# Patient Record
Sex: Male | Born: 1950 | Race: White | Hispanic: No | Marital: Married | Smoking: Never smoker
Health system: Southern US, Community
[De-identification: ages and names within clinical notes are randomized; demographics above are authoritative.]

## PROBLEM LIST (undated history)

## (undated) DIAGNOSIS — C189 Malignant neoplasm of colon, unspecified: Secondary | ICD-10-CM

## (undated) DIAGNOSIS — G4730 Sleep apnea, unspecified: Secondary | ICD-10-CM

## (undated) DIAGNOSIS — Z5189 Encounter for other specified aftercare: Secondary | ICD-10-CM

## (undated) DIAGNOSIS — I10 Essential (primary) hypertension: Secondary | ICD-10-CM

## (undated) DIAGNOSIS — T7840XA Allergy, unspecified, initial encounter: Secondary | ICD-10-CM

## (undated) DIAGNOSIS — E669 Obesity, unspecified: Secondary | ICD-10-CM

## (undated) HISTORY — DX: Sleep apnea, unspecified: G47.30

## (undated) HISTORY — DX: Allergy, unspecified, initial encounter: T78.40XA

## (undated) HISTORY — DX: Essential (primary) hypertension: I10

## (undated) HISTORY — DX: Obesity, unspecified: E66.9

## (undated) HISTORY — PX: TONSILLECTOMY AND ADENOIDECTOMY: SHX28

## (undated) HISTORY — DX: Encounter for other specified aftercare: Z51.89

---

## 1978-08-23 HISTORY — PX: NOSE SURGERY: SHX723

## 1982-08-23 DIAGNOSIS — IMO0001 Reserved for inherently not codable concepts without codable children: Secondary | ICD-10-CM

## 1982-08-23 DIAGNOSIS — Z5189 Encounter for other specified aftercare: Secondary | ICD-10-CM

## 1982-08-23 HISTORY — DX: Reserved for inherently not codable concepts without codable children: IMO0001

## 1982-08-23 HISTORY — DX: Encounter for other specified aftercare: Z51.89

## 1982-08-23 HISTORY — PX: OTHER SURGICAL HISTORY: SHX169

## 2010-04-20 ENCOUNTER — Emergency Department (HOSPITAL_COMMUNITY): Admission: EM | Admit: 2010-04-20 | Discharge: 2010-04-20 | Payer: Self-pay | Admitting: Family Medicine

## 2010-10-22 HISTORY — PX: ANKLE FUSION: SHX881

## 2010-11-12 ENCOUNTER — Encounter (HOSPITAL_COMMUNITY)
Admission: RE | Admit: 2010-11-12 | Discharge: 2010-11-12 | Disposition: A | Discharge status: Home / Self Care | Payer: PRIVATE HEALTH INSURANCE | Source: Ambulatory Visit | Attending: Orthopedic Surgery | Admitting: Orthopedic Surgery

## 2010-11-12 ENCOUNTER — Other Ambulatory Visit (HOSPITAL_COMMUNITY): Payer: Self-pay | Admitting: Orthopedic Surgery

## 2010-11-12 DIAGNOSIS — M19072 Primary osteoarthritis, left ankle and foot: Secondary | ICD-10-CM

## 2010-11-12 LAB — CBC
HCT: 43.7 % (ref 39.0–52.0)
MCHC: 36.6 g/dL — ABNORMAL HIGH (ref 30.0–36.0)
MCV: 89.9 fL (ref 78.0–100.0)
Platelets: 152 10*3/uL (ref 150–400)
RBC: 4.86 MIL/uL (ref 4.22–5.81)
RDW: 12.7 % (ref 11.5–15.5)

## 2010-11-12 LAB — BASIC METABOLIC PANEL
BUN: 12 mg/dL (ref 6–23)
Chloride: 111 mEq/L (ref 96–112)
Creatinine, Ser: 1.22 mg/dL (ref 0.4–1.5)
GFR calc Af Amer: >60 mL/min (ref >60)
GFR calc non Af Amer: >60 mL/min (ref >60)
Glucose, Bld: 192 mg/dL — ABNORMAL HIGH (ref 70–99)
Potassium: 4.4 mEq/L (ref 3.5–5.1)
Sodium: 143 mEq/L (ref 135–145)

## 2010-11-12 LAB — SURGICAL PCR SCREEN: MRSA, PCR: NEGATIVE

## 2010-11-13 ENCOUNTER — Observation Stay (HOSPITAL_COMMUNITY)
Admission: RE | Admit: 2010-11-13 | Discharge: 2010-11-14 | Disposition: A | Discharge status: Home / Self Care | Payer: PRIVATE HEALTH INSURANCE | Source: Ambulatory Visit | Attending: Orthopedic Surgery | Admitting: Orthopedic Surgery

## 2010-11-13 ENCOUNTER — Ambulatory Visit (HOSPITAL_BASED_OUTPATIENT_CLINIC_OR_DEPARTMENT_OTHER)
Admission: RE | Admit: 2010-11-13 | Payer: PRIVATE HEALTH INSURANCE | Source: Ambulatory Visit | Admitting: Orthopedic Surgery

## 2010-11-13 ENCOUNTER — Ambulatory Visit (HOSPITAL_COMMUNITY): Payer: PRIVATE HEALTH INSURANCE

## 2010-11-13 DIAGNOSIS — E669 Obesity, unspecified: Secondary | ICD-10-CM | POA: Insufficient documentation

## 2010-11-13 DIAGNOSIS — M19079 Primary osteoarthritis, unspecified ankle and foot: Principal | ICD-10-CM | POA: Insufficient documentation

## 2010-11-13 DIAGNOSIS — E119 Type 2 diabetes mellitus without complications: Secondary | ICD-10-CM | POA: Insufficient documentation

## 2010-11-13 DIAGNOSIS — I10 Essential (primary) hypertension: Secondary | ICD-10-CM | POA: Insufficient documentation

## 2010-11-13 DIAGNOSIS — Z01812 Encounter for preprocedural laboratory examination: Secondary | ICD-10-CM | POA: Insufficient documentation

## 2010-11-13 LAB — GLUCOSE, CAPILLARY: Glucose-Capillary: 206 mg/dL — ABNORMAL HIGH (ref 70–99)

## 2010-11-16 LAB — GLUCOSE, CAPILLARY: Glucose-Capillary: 143 mg/dL — ABNORMAL HIGH (ref 70–99)

## 2010-11-17 NOTE — Op Note (Signed)
NAME:  Daniel Greer, Daniel Greer NO.:  [id number]  MEDICAL RECORD NO.:  [id number]           PATIENT TYPE:  O  LOCATION:  5041                         FACILITY:  [facility]  PHYSICIAN:  Toni Arthurs, MD        DATE OF BIRTH:  1951/03/16  DATE OF PROCEDURE:  11/13/2010 DATE OF DISCHARGE:                              OPERATIVE REPORT   PREOPERATIVE DIAGNOSIS:  Left ankle varus arthritis.  POSTOPERATIVE DIAGNOSIS:  Left ankle varus arthritis.  PROCEDURE: 1. Left hip aspiration of iliac crest bone marrow. 2. Left ankle arthrodesis. 3. Intraoperative interpretation of fluoroscopic images greater than 1     hour.  SURGEON:  Toni Arthurs, MD  ANESTHESIA:  General, regional.  IV FLUIDS:  See Anesthesia record.  ESTIMATED BLOOD LOSS:  Minimal.  TOURNIQUET TIME:  2 hours and 22 minutes at 250 mmHg.  COMPLICATIONS:  None apparent.  DISPOSITION:  Extubated awake and stable to recovery.  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  He presents now for arthrodesis of his left ankle having failed conservative treatments.  He understands the risks and benefits of this procedure as well as the alternative treatment options.  Specifically, he understands risks of bleeding, infection, nerve damage, blood clots, need for additional surgery, amputation, and death.  PROCEDURE IN DETAIL:  After operative consent was obtained and the correct operative site was identified, the patient was brought to the operating room and placed supine on the operating table.  General anesthesia was induced.  Preoperative antibiotics were administered. Surgical time-out was taken.  The left hip was prepped and draped.  A stab incision was made 3 fingerbreadths posterior from the superior aspect of the ASIS.  A sharp trocar was advanced down through the subcutaneous tissue to the superior border of the iliac crest.  The trocar was  centered on the crest and driven through the outer cortex with a mallet.  The sharp trocar was exchanged for a blunt trocar and the sheath was advanced into the iliac crest bone.  A 60 mL of bone marrow were withdrawn from the iliac crest in 5 mL draws in alternating locations.  The bone marrow was passed off the field to the perfusionist.  This was spun down to 7 mL of concentrated bone marrow aspirate.  It was maintained in a sterile environment until later in the case.  The stab incision was dressed with a Steri-Strip and a sterile dressing after removal of the trocar and cannula.  The left lower extremity was then prepped and draped in standard sterile fashion.  An anterior incision was marked over the ankle joint.  The extremity was exsanguinated, and tourniquet was inflated to 250 mmHg. The anterior incision was made.  Sharp dissection was carried down through the skin to the level of the extensor retinaculum taking care to protect the crossing branches of superficial peroneal nerve.  Extensor retinaculum was divided over the extensor hallucis longus tendon.  The interval between the tibialis anterior and the EHL was developed. Neurovascular bundle was mobilized off the anterior aspect  of the tibia. The subperiosteal dissection was carried medially and laterally.  The soft tissues were released from around the medial malleolus allowing correction of varus malalignment of the ankle.  A lamina spreader was used to open the ankle joint.  All remaining cartilage was removed with an osteotome and curettes.  The wound was irrigated copiously.  A 3.5-mm drill was used to perforate the subchondral bone throughout the talar dome and tibial plafond.  A quarter-inch straight osteotome was then used to break up the remaining subchondral bone between the drill holes. At this point, the 7 mL of concentrated bone marrow aspirate were mixed with allograft cancellous bone chips.  These were packed  into the ankle joint.  The ankle was reduced and held in neutral alignment.  A 2.4-mm guidepin was used to provisionally fix the ankle.  An anterolateral plate was selected from the Integra Tibiaxys set.  The plate was inserted.  A drill bit was inserted through the locking sleeve distally into the talar neck.  AP and lateral x-ray showed appropriate position of the plate.  Three bicortical locking screws were then inserted through the distal limb of the plate securing the plate to the talar neck.  The compression device was then fixed to the proximal limb of the plate.  A 3.5-mm screw was inserted in bicortical fashion securing the compression device to the tibia.  The compression device was tightened compressing the ankle joint.  Three bicortical locking screws were then inserted through the plate securing the plate to the tibia.  All 3 screws were then locked to the plate with lock washers.  The compression device was removed and a fourth bicortical screw was inserted and locked.  AP and lateral views showed appropriate position and length of all hardware and appropriate compression of the ankle joint.  An anterolateral plate was then applied in a similar fashion.  At this point, the most distal hole in the proximal limb of the medial plate was selected and drilled.  A fully-threaded screw was then placed through this hole into the most posterolateral aspect of the talar dome.  This was repeated for the anteromedial plate.  Final AP, mortise, and lateral views of the ankle showed appropriate position and length of all hardware and appropriate reduction of the ankle joint as well as appropriate compression across the ankle joint.  The remaining bone graft was packed around the edges of the joint filling at all remaining cracks.  The wound was irrigated copiously.  The balance of the forefoot was assessed and there was no evidence of excessive plantar flexion of the first ray.  The hind  foot was noted to be in appropriate neutral to slightly valgus alignment.  The 0 Vicryl simple sutures were used to close the anterior ankle joint capsule over the plates.  The extensor retinaculum was then closed with simple sutures of 0 Vicryl. Subcutaneous tissues were closed with inverted simple sutures of 2-0 Monocryl.  A running 3-0 Prolene skin suture was used to close the incision.  Sterile dressings were applied followed by a well-padded short-leg splint.  The provisional guide pin had been removed prior to application of the anteromedial plate.  The tourniquet was released after application of the dressings.  The patient was then awakened from anesthesia and transported to recovery room in stable condition.  FOLLOW UP PLAN:  The patient will be nonweightbearing on the left lower extremity.  He will be admitted overnight for observation for pain control.  He  will follow up with me in 2 weeks in clinic.     Toni Arthurs, MD    JH/MEDQ  D:  11/13/2010  T:  11/14/2010  Job:  [id number]  Electronically Signed by Toni Arthurs  on 11/17/2010 01:38:42 PM

## 2011-04-14 ENCOUNTER — Other Ambulatory Visit: Payer: Self-pay | Admitting: Orthopaedic Surgery

## 2011-04-14 DIAGNOSIS — M25511 Pain in right shoulder: Secondary | ICD-10-CM

## 2011-04-20 ENCOUNTER — Other Ambulatory Visit: Payer: Self-pay | Admitting: Orthopaedic Surgery

## 2011-04-20 DIAGNOSIS — Z139 Encounter for screening, unspecified: Secondary | ICD-10-CM

## 2011-04-21 ENCOUNTER — Ambulatory Visit
Admission: RE | Admit: 2011-04-21 | Discharge: 2011-04-21 | Disposition: A | Discharge status: Home / Self Care | Payer: PRIVATE HEALTH INSURANCE | Source: Ambulatory Visit | Attending: Orthopaedic Surgery | Admitting: Orthopaedic Surgery

## 2011-04-21 DIAGNOSIS — M25511 Pain in right shoulder: Secondary | ICD-10-CM

## 2011-04-21 DIAGNOSIS — Z139 Encounter for screening, unspecified: Secondary | ICD-10-CM

## 2011-04-21 MED ORDER — IOHEXOL 180 MG/ML  SOLN
11.0000 mL | Freq: Once | INTRAMUSCULAR | Status: AC | PRN
  Administered 2011-04-21: 11 mL via INTRA_ARTICULAR

## 2011-04-24 HISTORY — PX: ROTATOR CUFF REPAIR: SHX139

## 2011-06-03 ENCOUNTER — Encounter: Payer: Self-pay | Admitting: Orthopaedic Surgery

## 2011-06-24 ENCOUNTER — Encounter: Payer: Self-pay | Admitting: Orthopaedic Surgery

## 2011-07-24 ENCOUNTER — Encounter: Payer: Self-pay | Admitting: Orthopaedic Surgery

## 2011-07-29 ENCOUNTER — Encounter: Payer: Self-pay | Admitting: Internal Medicine

## 2011-08-11 ENCOUNTER — Other Ambulatory Visit: Payer: PRIVATE HEALTH INSURANCE | Admitting: Internal Medicine

## 2011-08-24 ENCOUNTER — Encounter: Payer: Self-pay | Admitting: Orthopaedic Surgery

## 2011-08-24 HISTORY — PX: COLONOSCOPY: SHX174

## 2011-08-24 HISTORY — PX: OTHER SURGICAL HISTORY: SHX169

## 2011-09-07 ENCOUNTER — Ambulatory Visit (AMBULATORY_SURGERY_CENTER): Payer: PRIVATE HEALTH INSURANCE

## 2011-09-07 VITALS — Ht 76.0 in | Wt 282.9 lb

## 2011-09-07 DIAGNOSIS — Z1211 Encounter for screening for malignant neoplasm of colon: Secondary | ICD-10-CM

## 2011-09-07 MED ORDER — PEG-KCL-NACL-NASULF-NA ASC-C 100 G PO SOLR: 1.0000 | Freq: Once | ORAL | Status: AC

## 2011-09-13 ENCOUNTER — Telehealth: Payer: Self-pay | Admitting: Internal Medicine

## 2011-09-13 NOTE — Telephone Encounter (Signed)
Pt's home contact number: [phone] is no longer in service.  Left message on pt's cell phone to return phone call.  Daniel Greer

## 2011-09-14 ENCOUNTER — Telehealth: Payer: Self-pay | Admitting: *Deleted

## 2011-09-14 NOTE — Telephone Encounter (Signed)
Explained to pts wife that MOVI PREP is a superior prep and preferred by Dr. Marina Goodell. /explained in detail how the generic prep is taken and the increase in the volume.  Pt. Wife said she would discuss with her husband and let us know what he decides.  She's pretty sure he was given the Rebate coupon.  Daniel Greer

## 2011-09-14 NOTE — Telephone Encounter (Signed)
Left message on cell number.  Daniel Greer

## 2011-09-21 ENCOUNTER — Encounter: Payer: Self-pay | Admitting: Internal Medicine

## 2011-09-21 ENCOUNTER — Ambulatory Visit (AMBULATORY_SURGERY_CENTER): Payer: PRIVATE HEALTH INSURANCE | Admitting: Internal Medicine

## 2011-09-21 VITALS — BP 170/98 | HR 66 | Temp 97.9°F | Resp 18 | Ht 76.0 in | Wt 282.0 lb

## 2011-09-21 DIAGNOSIS — C180 Malignant neoplasm of cecum: Secondary | ICD-10-CM

## 2011-09-21 DIAGNOSIS — Z1211 Encounter for screening for malignant neoplasm of colon: Secondary | ICD-10-CM

## 2011-09-21 DIAGNOSIS — D126 Benign neoplasm of colon, unspecified: Secondary | ICD-10-CM

## 2011-09-21 MED ORDER — SODIUM CHLORIDE 0.9 % IV SOLN: 500.0000 mL | INTRAVENOUS | Status: DC

## 2011-09-21 NOTE — Progress Notes (Signed)
Patient did not experience any of the following events: a burn prior to discharge; a fall within the facility; wrong site/side/patient/procedure/implant event; or a hospital transfer or hospital admission upon discharge from the facility. (G8907) Patient did not have preoperative order for IV antibiotic SSI prophylaxis. (G8918)  

## 2011-09-21 NOTE — Progress Notes (Signed)
2nd bag of ivf hung during procedure

## 2011-09-21 NOTE — Op Note (Signed)
[facility] [address] [place], [place]  [address]  COLONOSCOPY PROCEDURE REPORT  PATIENT:  Daniel Greer, Daniel Greer  MR#:  [id number] BIRTHDATE:  02/05/1951, 60 yrs. old  GENDER:  male ENDOSCOPIST:  Wilhemina Bonito. Eda Keys, MD REF. BY:  Guerry Bruin, M.D. PROCEDURE DATE:  09/21/2011 PROCEDURE:  Colonoscopy with snare polypectomy x 3 ASA CLASS:  Class II INDICATIONS:  Routine Risk Screening MEDICATIONS:   MAC sedation, administered by CRNA, propofol (Diprivan) 650 mg IV  DESCRIPTION OF PROCEDURE:   After the risks benefits and alternatives of the procedure were thoroughly explained, informed consent was obtained.  Digital rectal exam was performed and revealed no abnormalities.   The LB 180AL [id number] endoscope was introduced through the anus and advanced to the cecum, which was identified by both the appendix and ileocecal valve, without limitations.  The quality of the prep was excellent, using MoviPrep.  The instrument was then slowly withdrawn as the colon was fully examined. <<PROCEDUREIMAGES>>  FINDINGS:  A 30mm sessile polyp was found on the distal ileocecal valve. It was removed piecemeal  with monopolar cautery. Retrieval was successful. Two polyps, 54mm,2mm in the transverse colon were snared without cautery and retrieved. Otherwise normal colonoscopy without other polyps, masses, vascular ectasias, or inflammatory changes.   Retroflexed views in the rectum revealed no abnormalities.    The time to cecum =2:58  minutes. The scope was then withdrawn in 32:44  minutes from the cecum and the procedure completed.  COMPLICATIONS:  None  ENDOSCOPIC IMPRESSION: 1) Large Sessile polyp at the ileocecal valve - removed 2) Two polyps in the transverse colon - removed 3) Otherwise normal colonoscopy  RECOMMENDATIONS: 1) Repeat Colonoscopy in 3 MONTHS (if no cancer in largest polyp) ______________________________ Wilhemina Bonito. Eda Keys, MD  CC:  The Patient;  Guerry Bruin,  MD  n. eSIGNED:   Wilhemina Bonito. Eda Keys at 09/21/2011 09:05 AM  Jeanell Sparrow, [phone]

## 2011-09-21 NOTE — Patient Instructions (Signed)
Discharge instructions given with verbal understanding. Handout on polyps given. Resume previous medications. 

## 2011-09-21 NOTE — Progress Notes (Signed)
Pressure applied to the abdomen to reach cecum. 

## 2011-09-22 ENCOUNTER — Telehealth: Payer: Self-pay

## 2011-09-22 NOTE — Telephone Encounter (Signed)
Left message on answering machine. 

## 2011-09-30 ENCOUNTER — Telehealth: Payer: Self-pay

## 2011-09-30 NOTE — Telephone Encounter (Signed)
Spoke with pt and he is aware of the appt date and time with Dr. Johna Sheriff.

## 2011-09-30 NOTE — Telephone Encounter (Signed)
Message copied by Michele Mcalpine on Thu Sep 30, 2011 10:45 AM ------      Message from: Hilarie Fredrickson      Created: Wed Sep 29, 2011  5:11 PM       That's fine Bonita Quin. Please secure that appointment and let patient know (call him on his mobile phone). Thanks             jp      ----- Message -----         From: Lily Lovings, RN         Sent: 09/29/2011  11:47 AM           To: Yancey Flemings, MD            Dr. Marina Goodell,            The 1st available with [facility] is 10/15/11 arrival time 2:20pm for a 2:45pm appt. Is that ok with you?            Bonita Quin            ----- Message -----         From: Yancey Flemings, MD         Sent: 09/29/2011  11:27 AM           To: Lily Lovings, RN            Bonita Quin, I spoke with Mr. Corrales regarding the polypectomy results. There was invasive cancer in the polypectomy specimen. He needs to see a general surgeon regarding laparoscopic right hemicolectomy. I explained this to him. Please arrange an appointment with Dr. Glenna Fellows. Please call the patient back on his mobile phone regarding that appointment. Keep me posted. Thanks. Dr. Marina Goodell

## 2011-10-15 ENCOUNTER — Ambulatory Visit (INDEPENDENT_AMBULATORY_CARE_PROVIDER_SITE_OTHER): Payer: PRIVATE HEALTH INSURANCE | Admitting: General Surgery

## 2011-10-15 ENCOUNTER — Encounter (INDEPENDENT_AMBULATORY_CARE_PROVIDER_SITE_OTHER): Payer: Self-pay | Admitting: General Surgery

## 2011-10-15 VITALS — BP 160/98 | HR 76 | Temp 97.8°F | Resp 18 | Ht 76.0 in | Wt 286.6 lb

## 2011-10-15 DIAGNOSIS — C182 Malignant neoplasm of ascending colon: Secondary | ICD-10-CM

## 2011-10-15 NOTE — Progress Notes (Signed)
Subjective:   New diagnosis colon cancer  Patient ID: Daniel Greer, male   DOB: 06/15/1951, 60 y.o.   MRN: [id number]  HPI Patient is a very pleasant 60-year-old male referred through the courtesy of Dr. Perry for a new diagnosis of adenocarcinoma of the colon. The patient recently underwent his initial screening colonoscopy. He had not been having any abdominal or GI symptoms whatsoever. Specifically he denies abdominal pain, bloating, nausea, vomiting, change in his bowel habits or melena. Weight has been stable. He's had no other complaints.  I reviewed the colonoscopy report. The most significant finding was a 30 mm sessile polyp at the ileocecal valve. This was removed piecemeal. 2 very small polyps were removed from the transverse colon.  Pathology has returned showing focal invasive adenocarcinoma arising in multiple fragments of a tubulovillous adenoma. The small polyps were benign adenomatous without dysplasia. The patient is here to discuss further treatment planning.  Past Medical History  Diagnosis Date  . Diabetes mellitus   . Hypertension   . Sleep apnea     hx of   Past Surgical History  Procedure Date  . Ankle fusion     left  . Rotator cuff repair     right  . Tonsillectomy and adenoidectomy   . Nose surgery    Current Outpatient Prescriptions  Medication Sig Dispense Refill  . benazepril (LOTENSIN) 20 MG tablet Take 20 mg by mouth daily.      . metFORMIN (GLUCOPHAGE) 500 MG tablet Take 500 mg by mouth 2 (two) times daily with a meal.       Current Facility-Administered Medications  Medication Dose Route Frequency Provider Last Rate Last Dose  . 0.9 %  sodium chloride infusion  500 mL Intravenous Continuous John Perry, MD       No Known Allergies History  Substance Use Topics  . Smoking status: Never Smoker   . Smokeless tobacco: Never Used  . Alcohol Use: No    Review of Systems  Constitutional: Negative.   HENT: Negative.   Eyes: Negative.     Respiratory: Negative.   Cardiovascular: Positive for leg swelling.  Gastrointestinal: Negative.        Mild occasional GERD  Genitourinary: Negative.   Musculoskeletal: Positive for arthralgias.  Neurological: Negative.   Hematological: Negative.   Psychiatric/Behavioral: Negative.        Objective:   Physical Exam BP 160/98  Pulse 76  Temp(Src) 97.8 F (36.6 C) (Temporal)  Resp 18  Ht 6' 4" (1.93 m)  Wt 286 lb 9.6 oz (130.001 kg)  BMI 34.89 kg/m2 General: Alert, moderately obese Caucasian male, in no distress Skin: Warm and dry without rash or infection. HEENT: No palpable masses or thyromegaly. Sclera nonicteric. Pupils equal round and reactive. Oropharynx clear. Lymph nodes: No cervical, supraclavicular, or inguinal nodes palpable. Lungs: Breath sounds clear and equal without increased work of breathing Cardiovascular: Regular rate and rhythm without murmur. No JVD or edema. Peripheral pulses intact. Abdomen: Nondistended. Soft and nontender. No masses palpable. No organomegaly. No palpable hernias. Extremities: No edema or joint swelling or deformity. No chronic venous stasis changes. Incision right shoulder with limited extension right shoulder Neurologic: Alert and fully oriented. Gait normal.     Assessment:     New diagnosis of adenocarcinoma arising in a villous adenoma at the ileocecal valve. He will require partial colectomy. I discussed the nature of the diagnosis and treatment the patient and his wife in detail. I have recommended laparoscopic right colectomy.   We have discussed the nature of the procedure, expected recovery, possible additional treatments, and risks of surgery including anesthetic risks, bleeding, infection, anastomotic leak, and small chance for temporary ostomy. They were given complete literature regarding the procedure all their questions were answered.    Plan:     Laparoscopic partial colectomy with AM admission the following mechanical  bowel prep at home      

## 2011-10-15 NOTE — Patient Instructions (Signed)
Cancer of the Colon, Treatment by Resection You and your caregiver have decided that surgical removal of your colon cancer is the best form of treatment for you. Your surgeon or surgeons will do their best to remove your entire tumor. To do this, some normal tissue must also be removed to give you the best chance for a cure. The following will help describe what happens when you have this surgery. TREATMENT  Surgery is the most common treatment for colorectal cancer. It is a type of local therapy. It treats the cancer in the colon or rectum and the area close to the tumor by removing the tumor and some of the healthy tissue around it. For larger cancers, your surgeon must make an cut (incision) into the belly (abdomen) so he or she can see the area of the tumor and remove it as well as part of the healthy colon or rectum. Some nearby lymph nodes also may be removed. The surgeon checks the rest of the abdomen, the intestine and the liver to see if the cancer has spread. When a section of the colon or rectum is removed, the surgeon can usually reconnect the healthy parts. However, sometimes reconnection is not possible. In this case, the surgeon creates a new path for waste to leave the body. The surgeon makes an opening (a stoma) in the wall of the abdomen. The upper end of the intestine is then connected to the stoma. The other end is closed. The operation to create the stoma is called a colostomy. A flat bag fits over the stoma to collect waste, and a special adhesive holds it in place.  Some colostomies are temporary. The colostomy is needed only until the colon or rectum heals from surgery. After healing takes place, the surgeon reconnects the parts of the intestine and closes the stoma. Other patients need a permanent colostomy.  ASK YOUR CAREGIVER THESE QUESTIONS BEFORE HAVING SURGERY:  What kind of operation do you recommend for me?   Do I need any lymph nodes removed? Will other tissues be removed?  Why?   What are the risks of surgery? Will I have any lasting side effects?   Will I need a colostomy? If so, will it be permanent?   How will I feel after the operation?   If I have pain, how will it be controlled?   How long will I be in the hospital?   When can I get back to my normal activities?  FOLLOW-UP CARE  Follow-up care after treatment for colorectal cancer is important. Even when the cancer seems to have been completely removed or destroyed, the disease sometimes returns. Undetected cancer cells may still remain somewhere in the body after treatment. The doctor keeps checking the person's recovery and checks for recurrence of the cancer. Recurrence means that the cancer comes back.  Checkups help make sure that changes in health are found. Checkups may include:  A physical exam (including a digital rectal exam). This means your caregiver checks you to see if there are any abnormal changes they can see or feel.   Lab tests (including fecal occult blood test and CEA test) may be done. The "fecal occult blood test" checks for blood in the stool. The CEA (carcinoembryonic antigen) is a blood test that looks for a marker of colon cancer in the blood.   A colonoscopy is a test where your caregiver examines your colon with a flexible instrument like a thin telescope which looks at the inside   of the large bowel.   Other specialized x-rays, CT scans, or other tests may be performed.  Between scheduled visits you should contact your caregivers as soon as any health problems appear. Document Released: 08/12/2003 Document Revised: 04/21/2011 Document Reviewed: 12/05/2007 [employer] Patient Information 2012 [employer]. 

## 2011-10-29 ENCOUNTER — Encounter (HOSPITAL_COMMUNITY): Payer: Self-pay | Admitting: Pharmacy Technician

## 2011-10-29 ENCOUNTER — Encounter (HOSPITAL_COMMUNITY): Payer: Self-pay

## 2011-10-29 ENCOUNTER — Encounter (HOSPITAL_COMMUNITY)
Admission: RE | Admit: 2011-10-29 | Discharge: 2011-10-29 | Disposition: A | Discharge status: Home / Self Care | Payer: PRIVATE HEALTH INSURANCE | Source: Ambulatory Visit | Attending: General Surgery | Admitting: General Surgery

## 2011-10-29 HISTORY — DX: Encounter for other specified aftercare: Z51.89

## 2011-10-29 HISTORY — DX: Malignant neoplasm of colon, unspecified: C18.9

## 2011-10-29 LAB — BASIC METABOLIC PANEL
BUN: 12 mg/dL (ref 6–23)
CO2: 27 mEq/L (ref 19–32)
Chloride: 100 mEq/L (ref 96–112)
Creatinine, Ser: 0.96 mg/dL (ref 0.50–1.35)
GFR calc Af Amer: >90 mL/min (ref >90)
Potassium: 4.5 mEq/L (ref 3.5–5.1)

## 2011-10-29 LAB — CBC
HCT: 42.5 % (ref 39.0–52.0)
MCV: 89.7 fL (ref 78.0–100.0)
RBC: 4.74 MIL/uL (ref 4.22–5.81)
WBC: 3.5 10*3/uL — ABNORMAL LOW (ref 4.0–10.5)

## 2011-10-29 LAB — SURGICAL PCR SCREEN: Staphylococcus aureus: POSITIVE — AB

## 2011-10-29 NOTE — Progress Notes (Signed)
10/29/11 1120  OBSTRUCTIVE SLEEP APNEA  Have you ever been diagnosed with sleep apnea through a sleep study? No  Do you snore loudly (loud enough to be heard through closed doors)?  1  Do you often feel tired, fatigued, or sleepy during the daytime? 0  Has anyone observed you stop breathing during your sleep? 0  Do you have, or are you being treated for high blood pressure? 1  BMI more than 35 kg/m2? 0  Age over 60 years old? 1  Neck circumference greater than 40 cm/18 inches? 0  Gender: 1  Obstructive Sleep Apnea Score 4   Score 4 or greater  Updated health history;Results sent to PCP

## 2011-10-29 NOTE — Patient Instructions (Signed)
20 Daniel Greer  10/29/2011   Your procedure is scheduled on:  11-08-2011  Report to [facility] at  0545 AM.  Call this number if you have problems the morning of surgery: [phone]   Remember:follow bowel pre instructions from dr Johna Sheriff   No liquids :After Midnight.  .  Take these medicines the morning of surgery with A SIP OF WATER: loratadine   Do not wear jewelry or make up.  Do not wear lotions, powders, or perfumes.Do not wear deodorant.    Do not bring valuables to the hospital.  Contacts, dentures or bridgework may not be worn into surgery.  Leave suitcase in the car. After surgery it may be brought to your room.  For patients admitted to the hospital, checkout time is 11:00 AM the day of discharge.     Special Instructions: CHG Shower Use Special Wash: 1/2 bottle night before surgery and 1/2 bottle morning of surgery.neck down avoid private area   Please read over the following fact sheets that you were given: MRSA Information, blood fact sheet  [facility] pre op nurse phone number [phone], call if needed

## 2011-10-29 NOTE — Pre-Procedure Instructions (Signed)
Chest 2 view xray 11-12-10 epic ekg 11-12-10 epic

## 2011-11-01 ENCOUNTER — Telehealth (INDEPENDENT_AMBULATORY_CARE_PROVIDER_SITE_OTHER): Payer: Self-pay | Admitting: General Surgery

## 2011-11-01 NOTE — Telephone Encounter (Signed)
Received call from Jimmie at [facility], asking which med to dispense:  NuLYTELY or GoLYTELY.  Per Dr. Biagio Quint, patient can choose which flavor he prefers.  Message given to pharmacy.

## 2011-11-08 ENCOUNTER — Encounter (HOSPITAL_COMMUNITY): Payer: Self-pay | Admitting: Certified Registered Nurse Anesthetist

## 2011-11-08 ENCOUNTER — Ambulatory Visit (HOSPITAL_COMMUNITY): Payer: PRIVATE HEALTH INSURANCE | Admitting: Certified Registered Nurse Anesthetist

## 2011-11-08 ENCOUNTER — Encounter (HOSPITAL_COMMUNITY)
Admission: RE | Disposition: A | Discharge status: Home / Self Care | Payer: Self-pay | Source: Ambulatory Visit | Attending: General Surgery

## 2011-11-08 ENCOUNTER — Encounter (HOSPITAL_COMMUNITY): Payer: Self-pay | Admitting: *Deleted

## 2011-11-08 ENCOUNTER — Inpatient Hospital Stay (HOSPITAL_COMMUNITY)
Admission: RE | Admit: 2011-11-08 | Discharge: 2011-11-12 | DRG: 330 | Disposition: A | Discharge status: Home / Self Care | Payer: PRIVATE HEALTH INSURANCE | Source: Ambulatory Visit | Attending: General Surgery | Admitting: General Surgery

## 2011-11-08 DIAGNOSIS — K560 Paralytic ileus: Secondary | ICD-10-CM | POA: Diagnosis present

## 2011-11-08 DIAGNOSIS — C189 Malignant neoplasm of colon, unspecified: Secondary | ICD-10-CM

## 2011-11-08 DIAGNOSIS — I10 Essential (primary) hypertension: Secondary | ICD-10-CM | POA: Diagnosis present

## 2011-11-08 DIAGNOSIS — Z01812 Encounter for preprocedural laboratory examination: Secondary | ICD-10-CM

## 2011-11-08 DIAGNOSIS — E119 Type 2 diabetes mellitus without complications: Secondary | ICD-10-CM | POA: Diagnosis present

## 2011-11-08 DIAGNOSIS — C182 Malignant neoplasm of ascending colon: Principal | ICD-10-CM | POA: Diagnosis present

## 2011-11-08 HISTORY — PX: COLON SURGERY: SHX602

## 2011-11-08 LAB — GLUCOSE, CAPILLARY
Glucose-Capillary: 160 mg/dL — ABNORMAL HIGH (ref 70–99)
Glucose-Capillary: 211 mg/dL — ABNORMAL HIGH (ref 70–99)

## 2011-11-08 LAB — CBC
Hemoglobin: 14.2 g/dL (ref 13.0–17.0)
MCH: 32.6 pg (ref 26.0–34.0)
MCHC: 36.4 g/dL — ABNORMAL HIGH (ref 30.0–36.0)
Platelets: 160 10*3/uL (ref 150–400)
RBC: 4.36 MIL/uL (ref 4.22–5.81)

## 2011-11-08 LAB — CREATININE, SERUM
Creatinine, Ser: 0.91 mg/dL (ref 0.50–1.35)
GFR calc non Af Amer: >90 mL/min (ref >90)

## 2011-11-08 SURGERY — LAPAROSCOPIC PARTIAL COLECTOMY
Anesthesia: General | Site: Abdomen | Wound class: Clean Contaminated

## 2011-11-08 MED ORDER — HYDROMORPHONE HCL PF 1 MG/ML IJ SOLN
INTRAMUSCULAR | Status: AC
  Filled 2011-11-08: qty 1

## 2011-11-08 MED ORDER — HYDROMORPHONE HCL PF 1 MG/ML IJ SOLN: 0.2500 mg | INTRAMUSCULAR | Status: DC | PRN

## 2011-11-08 MED ORDER — KETOROLAC TROMETHAMINE 30 MG/ML IJ SOLN
INTRAMUSCULAR | Status: AC
  Administered 2011-11-08: 30 mg via INTRAVENOUS
  Filled 2011-11-08: qty 1

## 2011-11-08 MED ORDER — SODIUM CHLORIDE 0.9 % IV SOLN
INTRAVENOUS | Status: AC
  Filled 2011-11-08: qty 1

## 2011-11-08 MED ORDER — MIDAZOLAM HCL 5 MG/5ML IJ SOLN
INTRAMUSCULAR | Status: DC | PRN
  Administered 2011-11-08: 2 mg via INTRAVENOUS

## 2011-11-08 MED ORDER — BUPIVACAINE-EPINEPHRINE 0.25% -1:200000 IJ SOLN
INTRAMUSCULAR | Status: AC
  Filled 2011-11-08: qty 1

## 2011-11-08 MED ORDER — LABETALOL HCL 5 MG/ML IV SOLN
INTRAVENOUS | Status: DC | PRN
  Administered 2011-11-08: 5 mg via INTRAVENOUS

## 2011-11-08 MED ORDER — GLYCOPYRROLATE 0.2 MG/ML IJ SOLN
INTRAMUSCULAR | Status: DC | PRN
  Administered 2011-11-08: .7 mg via INTRAVENOUS

## 2011-11-08 MED ORDER — BENAZEPRIL HCL 20 MG PO TABS
20.0000 mg | ORAL_TABLET | Freq: Every day | ORAL | Status: DC
  Administered 2011-11-08 – 2011-11-11 (×4): 20 mg via ORAL
  Filled 2011-11-08 (×6): qty 1

## 2011-11-08 MED ORDER — INSULIN ASPART 100 UNIT/ML ~~LOC~~ SOLN
0.0000 [IU] | SUBCUTANEOUS | Status: DC
  Administered 2011-11-08: 3 [IU] via SUBCUTANEOUS
  Administered 2011-11-08: 11 [IU] via SUBCUTANEOUS
  Administered 2011-11-08: 5 [IU] via SUBCUTANEOUS
  Administered 2011-11-09 (×5): 3 [IU] via SUBCUTANEOUS

## 2011-11-08 MED ORDER — LACTATED RINGERS IV SOLN
INTRAVENOUS | Status: DC | PRN
  Administered 2011-11-08: 08:00:00 via INTRAVENOUS

## 2011-11-08 MED ORDER — PROMETHAZINE HCL 25 MG/ML IJ SOLN: 6.2500 mg | INTRAMUSCULAR | Status: DC | PRN

## 2011-11-08 MED ORDER — ALVIMOPAN 12 MG PO CAPS
12.0000 mg | ORAL_CAPSULE | Freq: Once | ORAL | Status: AC
  Administered 2011-11-08: 12 mg via ORAL

## 2011-11-08 MED ORDER — SUCCINYLCHOLINE CHLORIDE 20 MG/ML IJ SOLN
INTRAMUSCULAR | Status: DC | PRN
  Administered 2011-11-08: 100 mg via INTRAVENOUS

## 2011-11-08 MED ORDER — LACTATED RINGERS IR SOLN
Status: DC | PRN
  Administered 2011-11-08: 3000 mL

## 2011-11-08 MED ORDER — ALVIMOPAN 12 MG PO CAPS
12.0000 mg | ORAL_CAPSULE | Freq: Two times a day (BID) | ORAL | Status: DC
  Administered 2011-11-09 – 2011-11-11 (×6): 12 mg via ORAL
  Filled 2011-11-08 (×8): qty 1

## 2011-11-08 MED ORDER — DROPERIDOL 2.5 MG/ML IJ SOLN
INTRAMUSCULAR | Status: DC | PRN
  Administered 2011-11-08: 0.625 mg via INTRAVENOUS

## 2011-11-08 MED ORDER — ONDANSETRON HCL 4 MG/2ML IJ SOLN
INTRAMUSCULAR | Status: DC | PRN
  Administered 2011-11-08: 4 mg via INTRAVENOUS

## 2011-11-08 MED ORDER — PROPOFOL 10 MG/ML IV EMUL
INTRAVENOUS | Status: DC | PRN
  Administered 2011-11-08: 150 mg via INTRAVENOUS

## 2011-11-08 MED ORDER — POTASSIUM CHLORIDE IN NACL 20-0.9 MEQ/L-% IV SOLN
INTRAVENOUS | Status: DC
  Administered 2011-11-08: 15:00:00 via INTRAVENOUS
  Administered 2011-11-08 – 2011-11-09 (×2): 125 mL via INTRAVENOUS
  Administered 2011-11-10: 11:00:00 via INTRAVENOUS
  Filled 2011-11-08 (×11): qty 1000

## 2011-11-08 MED ORDER — ESMOLOL HCL 10 MG/ML IV SOLN
INTRAVENOUS | Status: DC | PRN
  Administered 2011-11-08 (×2): 20 mg via INTRAVENOUS

## 2011-11-08 MED ORDER — INSULIN ASPART 100 UNIT/ML ~~LOC~~ SOLN
SUBCUTANEOUS | Status: AC
  Administered 2011-11-08: 5 [IU] via SUBCUTANEOUS
  Filled 2011-11-08: qty 1

## 2011-11-08 MED ORDER — SODIUM CHLORIDE 0.9 % IV SOLN
1.0000 g | INTRAVENOUS | Status: AC
  Administered 2011-11-08: 1 g via INTRAVENOUS

## 2011-11-08 MED ORDER — ALVIMOPAN 12 MG PO CAPS
ORAL_CAPSULE | ORAL | Status: AC
  Filled 2011-11-08: qty 1

## 2011-11-08 MED ORDER — ACETAMINOPHEN 10 MG/ML IV SOLN
INTRAVENOUS | Status: AC
  Filled 2011-11-08: qty 100

## 2011-11-08 MED ORDER — FENTANYL CITRATE 0.05 MG/ML IJ SOLN
INTRAMUSCULAR | Status: DC | PRN
  Administered 2011-11-08: 50 ug via INTRAVENOUS
  Administered 2011-11-08: 100 ug via INTRAVENOUS
  Administered 2011-11-08 (×4): 50 ug via INTRAVENOUS
  Administered 2011-11-08: 100 ug via INTRAVENOUS
  Administered 2011-11-08: 50 ug via INTRAVENOUS

## 2011-11-08 MED ORDER — HETASTARCH-ELECTROLYTES 6 % IV SOLN
INTRAVENOUS | Status: DC | PRN
  Administered 2011-11-08: 08:00:00 via INTRAVENOUS

## 2011-11-08 MED ORDER — ACETAMINOPHEN 10 MG/ML IV SOLN
INTRAVENOUS | Status: DC | PRN
  Administered 2011-11-08: 1000 mg via INTRAVENOUS

## 2011-11-08 MED ORDER — HEPARIN SODIUM (PORCINE) 5000 UNIT/ML IJ SOLN
5000.0000 [IU] | Freq: Three times a day (TID) | INTRAMUSCULAR | Status: DC
  Administered 2011-11-09 – 2011-11-12 (×10): 5000 [IU] via SUBCUTANEOUS
  Filled 2011-11-08 (×13): qty 1

## 2011-11-08 MED ORDER — ROCURONIUM BROMIDE 100 MG/10ML IV SOLN
INTRAVENOUS | Status: DC | PRN
  Administered 2011-11-08: 10 mg via INTRAVENOUS
  Administered 2011-11-08: 40 mg via INTRAVENOUS
  Administered 2011-11-08: 20 mg via INTRAVENOUS
  Administered 2011-11-08 (×2): 10 mg via INTRAVENOUS

## 2011-11-08 MED ORDER — NEOSTIGMINE METHYLSULFATE 1 MG/ML IJ SOLN
INTRAMUSCULAR | Status: DC | PRN
  Administered 2011-11-08: 5 mg via INTRAVENOUS

## 2011-11-08 MED ORDER — KETOROLAC TROMETHAMINE 30 MG/ML IJ SOLN
15.0000 mg | Freq: Once | INTRAMUSCULAR | Status: AC | PRN
  Administered 2011-11-08: 30 mg via INTRAVENOUS

## 2011-11-08 MED ORDER — DEXAMETHASONE SODIUM PHOSPHATE 10 MG/ML IJ SOLN
INTRAMUSCULAR | Status: DC | PRN
  Administered 2011-11-08: 10 mg via INTRAVENOUS

## 2011-11-08 MED ORDER — BUPIVACAINE LIPOSOME 1.3 % IJ SUSP
20.0000 mL | Freq: Once | INTRAMUSCULAR | Status: AC
  Administered 2011-11-08: 20 mL
  Filled 2011-11-08: qty 20

## 2011-11-08 MED ORDER — OXYCODONE-ACETAMINOPHEN 5-325 MG PO TABS
1.0000 | ORAL_TABLET | ORAL | Status: DC | PRN
Start: 2011-11-08 — End: 2011-11-12
  Administered 2011-11-10: 1 via ORAL
  Administered 2011-11-10 – 2011-11-12 (×9): 2 via ORAL
  Filled 2011-11-08 (×10): qty 2

## 2011-11-08 MED ORDER — MORPHINE SULFATE 2 MG/ML IJ SOLN
2.0000 mg | INTRAMUSCULAR | Status: DC | PRN
  Administered 2011-11-08: 2 mg via INTRAVENOUS
  Administered 2011-11-09 (×6): 4 mg via INTRAVENOUS
  Administered 2011-11-09: 2 mg via INTRAVENOUS
  Administered 2011-11-10: 6 mg via INTRAVENOUS
  Administered 2011-11-10 (×2): 4 mg via INTRAVENOUS
  Filled 2011-11-08: qty 3
  Filled 2011-11-08 (×2): qty 2
  Filled 2011-11-08: qty 1
  Filled 2011-11-08 (×3): qty 2
  Filled 2011-11-08: qty 1
  Filled 2011-11-08 (×3): qty 2

## 2011-11-08 MED ORDER — HYDROMORPHONE HCL PF 1 MG/ML IJ SOLN
INTRAMUSCULAR | Status: DC | PRN
  Administered 2011-11-08: 1 mg via INTRAVENOUS
  Administered 2011-11-08 (×2): 0.5 mg via INTRAVENOUS

## 2011-11-08 MED ORDER — LIDOCAINE HCL (CARDIAC) 20 MG/ML IV SOLN
INTRAVENOUS | Status: DC | PRN
  Administered 2011-11-08: 100 mg via INTRAVENOUS

## 2011-11-08 SURGICAL SUPPLY — 83 items
APPLIER CLIP 5 13 M/L LIGAMAX5 (MISCELLANEOUS) ×2
APPLIER CLIP ROT 10 11.4 M/L (STAPLE) ×2
BAG URINE DRAINAGE (UROLOGICAL SUPPLIES) IMPLANT
BAG URO CATCHER STRL LF (DRAPE) IMPLANT
BLADE EXTENDED COATED 6.5IN (ELECTRODE) ×2 IMPLANT
BLADE HEX COATED 2.75 (ELECTRODE) ×2 IMPLANT
BLADE SURG SZ10 CARB STEEL (BLADE) ×2 IMPLANT
CABLE HIGH FREQUENCY MONO STRZ (ELECTRODE) ×2 IMPLANT
CANISTER SUCTION 2500CC (MISCELLANEOUS) ×4 IMPLANT
CATH FOLEY SILVER 30CC 28FR (CATHETERS) IMPLANT
CELLS DAT CNTRL 66122 CELL SVR (MISCELLANEOUS) ×1 IMPLANT
CLIP APPLIE 5 13 M/L LIGAMAX5 (MISCELLANEOUS) ×1 IMPLANT
CLIP APPLIE ROT 10 11.4 M/L (STAPLE) ×1 IMPLANT
CLOTH BEACON ORANGE TIMEOUT ST (SAFETY) ×2 IMPLANT
COVER MAYO STAND STRL (DRAPES) ×2 IMPLANT
DECANTER SPIKE VIAL GLASS SM (MISCELLANEOUS) IMPLANT
DERMABOND ADVANCED (GAUZE/BANDAGES/DRESSINGS) ×1
DERMABOND ADVANCED .7 DNX12 (GAUZE/BANDAGES/DRESSINGS) ×1 IMPLANT
DRAIN CHANNEL 19F RND (DRAIN) IMPLANT
DRAPE CAMERA CLOSED 9X96 (DRAPES) ×2 IMPLANT
DRAPE LAPAROSCOPIC ABDOMINAL (DRAPES) ×2 IMPLANT
DRAPE LG THREE QUARTER DISP (DRAPES) ×2 IMPLANT
DRAPE WARM FLUID 44X44 (DRAPE) ×4 IMPLANT
ELECT REM PT RETURN 9FT ADLT (ELECTROSURGICAL) ×2
ELECTRODE REM PT RTRN 9FT ADLT (ELECTROSURGICAL) ×1 IMPLANT
FILTER SMOKE EVAC LAPAROSHD (FILTER) ×2 IMPLANT
GLOVE BIOGEL PI IND STRL 7.0 (GLOVE) ×2 IMPLANT
GLOVE BIOGEL PI INDICATOR 7.0 (GLOVE) ×2
GLOVE ECLIPSE 8.0 STRL XLNG CF (GLOVE) IMPLANT
GLOVE INDICATOR 8.0 STRL GRN (GLOVE) ×2 IMPLANT
GOWN STRL NON-REIN LRG LVL3 (GOWN DISPOSABLE) ×4 IMPLANT
GOWN STRL REIN XL XLG (GOWN DISPOSABLE) ×6 IMPLANT
GRASPER LAPSCPC 5X35 EPIX (ENDOMECHANICALS) IMPLANT
HAND ACTIVATED (MISCELLANEOUS) ×2 IMPLANT
KIT BASIN OR (CUSTOM PROCEDURE TRAY) ×2 IMPLANT
LEGGING LITHOTOMY PAIR STRL (DRAPES) ×2 IMPLANT
LIGASURE IMPACT 36 18CM CVD LR (INSTRUMENTS) IMPLANT
NS IRRIG 1000ML POUR BTL (IV SOLUTION) ×4 IMPLANT
PACK GENERAL/GYN (CUSTOM PROCEDURE TRAY) ×2 IMPLANT
PENCIL BUTTON HOLSTER BLD 10FT (ELECTRODE) ×2 IMPLANT
PUMP PAIN ON-Q (MISCELLANEOUS) IMPLANT
RELOAD PROXIMATE 75MM BLUE (ENDOMECHANICALS) ×4 IMPLANT
RELOAD PROXIMATE TA60MM BLUE (ENDOMECHANICALS) ×2 IMPLANT
RTRCTR WOUND ALEXIS 18CM MED (MISCELLANEOUS) ×2
SCISSORS LAP 5X35 DISP (ENDOMECHANICALS) ×2 IMPLANT
SEALER TISSUE G2 CVD JAW 35 (ENDOMECHANICALS) IMPLANT
SEALER TISSUE G2 CVD JAW 45CM (ENDOMECHANICALS)
SET IRRIG TUBING LAPAROSCOPIC (IRRIGATION / IRRIGATOR) ×2 IMPLANT
SLEEVE Z-THREAD 5X100MM (TROCAR) IMPLANT
SOLUTION ANTI FOG 6CC (MISCELLANEOUS) ×2 IMPLANT
SPONGE GAUZE 4X4 12PLY (GAUZE/BANDAGES/DRESSINGS) ×2 IMPLANT
SPONGE LAP 18X18 X RAY DECT (DISPOSABLE) ×2 IMPLANT
STAPLER GUN LINEAR PROX 60 (STAPLE) ×2 IMPLANT
STAPLER PROXIMATE 75MM BLUE (STAPLE) ×2 IMPLANT
STAPLER VISISTAT 35W (STAPLE) ×2 IMPLANT
SUCTION POOLE TIP (SUCTIONS) ×2 IMPLANT
SUT PDS AB 1 CTX 36 (SUTURE) ×4 IMPLANT
SUT PDS AB 1 TP1 96 (SUTURE) IMPLANT
SUT PROLENE 2 0 KS (SUTURE) IMPLANT
SUT SILK 2 0 (SUTURE) ×1
SUT SILK 2 0 SH CR/8 (SUTURE) ×2 IMPLANT
SUT SILK 2-0 18XBRD TIE 12 (SUTURE) ×1 IMPLANT
SUT SILK 3 0 (SUTURE) ×1
SUT SILK 3 0 SH CR/8 (SUTURE) ×2 IMPLANT
SUT SILK 3-0 18XBRD TIE 12 (SUTURE) ×1 IMPLANT
SUT VIC AB 2-0 SH 18 (SUTURE) IMPLANT
SUT VICRYL 2 0 18  UND BR (SUTURE) ×2
SUT VICRYL 2 0 18 UND BR (SUTURE) ×2 IMPLANT
SYR 30ML LL (SYRINGE) IMPLANT
SYRINGE IRR TOOMEY STRL 70CC (SYRINGE) IMPLANT
SYS LAPSCP GELPORT 120MM (MISCELLANEOUS)
SYSTEM LAPSCP GELPORT 120MM (MISCELLANEOUS) IMPLANT
TOWEL OR 17X26 10 PK STRL BLUE (TOWEL DISPOSABLE) ×4 IMPLANT
TRAY FOLEY CATH 14FRSI W/METER (CATHETERS) ×2 IMPLANT
TRAY LAP CHOLE (CUSTOM PROCEDURE TRAY) ×2 IMPLANT
TROCAR 12MM C0R28 OPT SYS (ENDOMECHANICALS) ×2 IMPLANT
TROCAR ADV FIXATION 5X100MM (TROCAR) ×6 IMPLANT
TROCAR Z-THREAD FIOS 11X100 BL (TROCAR) IMPLANT
TROCAR Z-THREAD FIOS 5X100MM (TROCAR) ×2 IMPLANT
TROCAR Z-THREAD SLEEVE 11X100 (TROCAR) IMPLANT
TUBING FILTER THERMOFLATOR (ELECTROSURGICAL) ×2 IMPLANT
YANKAUER SUCT BULB TIP 10FT TU (MISCELLANEOUS) ×2 IMPLANT
YANKAUER SUCT BULB TIP NO VENT (SUCTIONS) ×2 IMPLANT

## 2011-11-08 NOTE — Preoperative (Signed)
Beta Blockers   Reason not to administer Beta Blockers:Not Applicable 

## 2011-11-08 NOTE — Op Note (Signed)
Preoperative Diagnosis: cancer of ascending colon   Postoprative Diagnosis: cancer of ascending colon   Procedure: Procedure(s): LAPAROSCOPIC PARTIAL COLECTOMY   Surgeon: Glenna Fellows T   Assistants: Ovidio Kin  Anesthesia:  General endotracheal anesthesiaDiagnos  Indications:  Patient is a 61 year old male who on a recent initial screening colonoscopy was found to have a large polyp at the ileocecal valve. This was removed piecemeal endoscopically. 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 We recommended proceeding with laparoscopic partial colectomy. I discussed the indications for the procedure, its nature, recovery, and risks of bleeding, infection, anesthetic complications, anastomotic leak and possible temporary ostomy. The patient understands and agrees and following a mechanical bowel prep at home he is brought to the operating room for this procedure.   Procedure Detail: Patient was brought to the operating room, placed in the supine position on the operating table, and general endotracheal anesthesia induced. He was carefully positioned in the semi-lithotomy position in yellowfin stirrups and the abdomen was widely sterilely prepped and draped after placing a Foley catheter. Patient timeout was performed the correct procedure verified. He received broad-spectrum preoperative IV antibiotics. PAS port in place. Access was obtained with a 1/2 cm midline incision just above the umbilicus with an open Hassan technique and pneumoperitoneum established. A 5 mm trochars were placed under direct vision in the right and left lower quadrant and in the right lateral mid abdomen. The omentum was elevated up over the colon and the patient was placed in Trendelenburg. The base of the mesentery of the cecum and terminal ilium was exposed and the peritoneum incised. The retroperitoneal plane between the mesentery and retroperitoneal structures was entered in immediately the ureter was  identified.  It was protected throughout the remainder of the dissection. The retroperitoneal dissection was then continued superiorly and laterally mobilizing the mesentery of the right colon up out of the retroperitoneum. The duodenum was identified medially and protected and the mesentery was mobilized out to the lateral peritoneal attachments and up toward the transverse colon. Following this lateral peritoneal attachments were divided beginning at the terminal ileum appendix and cecum and working proximally. At this point the ileocolic pedicle was exposed by elevating the mesentery and it was encircled with careful blunt dissection and it was divided near its origin with the harmonic scalpel and clipping the artery proximally. The point of distal resection at the proximal transverse colon was chosen and this was mobilized dividing the omental attachments and working back around toward the hepatic flexure which was completely mobilized with the harmonic scalpel. The specimen was then further mobilized medially and the duodenum exposed from the lateral aspect. After full mobilization in approximately 6-7 cm incision was made extending the [facility] port and the wound protector placed. The cecum and terminal ileum were brought up into the incision in the right colon brought out distally to the transverse colon with good exposure of the mesentery. The terminal ileum about 8 cm from the ileocecal valve in the transverse colon at the planned point of resection were divided with single firings of the GIA 75 mm stapler.  The remaining mesentery was then divided between clamps and tied with 2-0 silk ties and the specimen removed. A functional end-to-end anastomosis was then created between the terminal ileum and the transverse colon with the GIA 75 mm stapler. The staple line was intact without bleeding and the anastomosis widely patent. The common enterotomy was closed with a TA 60 stapler. The anastomosis was airtight to  pressure. The viscera then  returned to the abdomen and gloves and instruments changed. Exparel local anesthetic was injected into the wound around the fascia. The midline fascia was closed with running #1 PDS begun at either end of the incision and tied centrally. The abdomen was then reinsufflated and examined with the 5 mm camera and there was no evidence of bleeding at the anastomosis lying naturally in the right upper quadrant. The omentum was replaced in the pelvis. All CO2 was evacuated trochars removed. Skin incisions were closed with subcuticular Monocryl and Dermabond. Sponge needle and instrument counts were correct.   Estimated Blood Loss:  less than 100 mL         Drains: none  Blood Given: none          Specimens: terminal ileum and right colon        Complications:  * No complications entered in OR log *         Disposition: PACU - hemodynamically stable.         Condition: stable  Mariella Saa MD, FACS  11/08/2011, 10:48 AM

## 2011-11-08 NOTE — Interval H&P Note (Signed)
History and Physical Interval Note:  11/08/2011 7:26 AM  Daniel Greer  has presented today for surgery, with the diagnosis of cancer of ascending colon   The various methods of treatment have been discussed with the patient and family. After consideration of risks, benefits and other options for treatment, the patient has consented to  Procedure(s) (LRB): LAPAROSCOPIC PARTIAL COLECTOMY (N/A) as a surgical intervention .  The patients' history has been reviewed, patient examined, no change in status, stable for surgery.  I have reviewed the patients' chart and labs.  Questions were answered to the patient's satisfaction.     Daniel Greer T

## 2011-11-08 NOTE — Anesthesia Postprocedure Evaluation (Signed)
  Anesthesia Post-op Note  Patient: Daniel Greer  Procedure(s) Performed: Procedure(s) (LRB): LAPAROSCOPIC PARTIAL COLECTOMY (N/A)  Patient Location: PACU  Anesthesia Type: General  Level of Consciousness: awake and alert   Airway and Oxygen Therapy: Patient Spontanous Breathing  Post-op Pain: mild  Post-op Assessment: Post-op Vital signs reviewed, Patient's Cardiovascular Status Stable, Respiratory Function Stable, Patent Airway and No signs of Nausea or vomiting  Post-op Vital Signs: stable  Complications: No apparent anesthesia complications

## 2011-11-08 NOTE — Transfer of Care (Signed)
Immediate Anesthesia Transfer of Care Note  Patient: Daniel Greer  Procedure(s) Performed: Procedure(s) (LRB): LAPAROSCOPIC PARTIAL COLECTOMY (N/A)  Patient Location: PACU  Anesthesia Type: General  Level of Consciousness: sedated, patient cooperative and responds to stimulaton  Airway & Oxygen Therapy: Patient Spontanous Breathing and Patient connected to face mask oxgen  Post-op Assessment: Report given to PACU RN and Post -op Vital signs reviewed and stable  Post vital signs: Reviewed and stable  Complications: No apparent anesthesia complications

## 2011-11-08 NOTE — Anesthesia Preprocedure Evaluation (Addendum)
Anesthesia Evaluation  Patient identified by MRN, date of birth, ID band Patient awake    Reviewed: Allergy & Precautions, H&P , NPO status , Patient's Chart, lab work & pertinent test results  Airway Mallampati: II TM Distance: >3 FB Neck ROM: Full    Dental No notable dental hx.    Pulmonary  breath sounds clear to auscultation  Pulmonary exam normal       Cardiovascular hypertension, Rhythm:Regular Rate:Normal     Neuro/Psych negative neurological ROS  negative psych ROS   GI/Hepatic negative GI ROS, Neg liver ROS,   Endo/Other  Diabetes mellitus-, Type 2, Oral Hypoglycemic Agents  Renal/GU negative Renal ROS  negative genitourinary   Musculoskeletal negative musculoskeletal ROS (+)   Abdominal   Peds negative pediatric ROS (+)  Hematology negative hematology ROS (+)   Anesthesia Other Findings   Reproductive/Obstetrics negative OB ROS                          Anesthesia Physical Anesthesia Plan  ASA: II  Anesthesia Plan: General   Post-op Pain Management:    Induction: Intravenous  Airway Management Planned: Oral ETT  Additional Equipment:   Intra-op Plan:   Post-operative Plan: Extubation in OR  Informed Consent: I have reviewed the patients History and Physical, chart, labs and discussed the procedure including the risks, benefits and alternatives for the proposed anesthesia with the patient or authorized representative who has indicated his/her understanding and acceptance.   Dental advisory given  Plan Discussed with: CRNA  Anesthesia Plan Comments:         Anesthesia Quick Evaluation

## 2011-11-08 NOTE — H&P (View-Only) (Signed)
Subjective:   New diagnosis colon cancer  Patient ID: Daniel Greer, male   DOB: 22-Nov-1950, 61 y.o.   MRN: [id number]  HPI Patient is a very pleasant 61 year old male referred through the courtesy of Dr. Marina Goodell for a new diagnosis of adenocarcinoma of the colon. The patient recently underwent his initial screening colonoscopy. He had not been having any abdominal or GI symptoms whatsoever. Specifically he denies abdominal pain, bloating, nausea, vomiting, change in his bowel habits or melena. Weight has been stable. He's had no other complaints.  I reviewed the colonoscopy report. The most significant finding was a 30 mm sessile polyp at the ileocecal valve. This was removed piecemeal. 2 very small polyps were removed from the transverse colon.  Pathology has returned showing focal invasive adenocarcinoma arising in multiple fragments of a tubulovillous adenoma. The small polyps were benign adenomatous without dysplasia. The patient is here to discuss further treatment planning.  Past Medical History  Diagnosis Date  . Diabetes mellitus   . Hypertension   . Sleep apnea     hx of   Past Surgical History  Procedure Date  . Ankle fusion     left  . Rotator cuff repair     right  . Tonsillectomy and adenoidectomy   . Nose surgery    Current Outpatient Prescriptions  Medication Sig Dispense Refill  . benazepril (LOTENSIN) 20 MG tablet Take 20 mg by mouth daily.      . metFORMIN (GLUCOPHAGE) 500 MG tablet Take 500 mg by mouth 2 (two) times daily with a meal.       Current Facility-Administered Medications  Medication Dose Route Frequency Provider Last Rate Last Dose  . 0.9 %  sodium chloride infusion  500 mL Intravenous Continuous Yancey Flemings, MD       No Known Allergies History  Substance Use Topics  . Smoking status: Never Smoker   . Smokeless tobacco: Never Used  . Alcohol Use: No    Review of Systems  Constitutional: Negative.   HENT: Negative.   Eyes: Negative.     Respiratory: Negative.   Cardiovascular: Positive for leg swelling.  Gastrointestinal: Negative.        Mild occasional GERD  Genitourinary: Negative.   Musculoskeletal: Positive for arthralgias.  Neurological: Negative.   Hematological: Negative.   Psychiatric/Behavioral: Negative.        Objective:   Physical Exam BP 160/98  Pulse 76  Temp(Src) 97.8 F (36.6 C) (Temporal)  Resp 18  Ht 6\' 4"  (1.93 m)  Wt 286 lb 9.6 oz (130.001 kg)  BMI 34.89 kg/m2 General: Alert, moderately obese Caucasian male, in no distress Skin: Warm and dry without rash or infection. HEENT: No palpable masses or thyromegaly. Sclera nonicteric. Pupils equal round and reactive. Oropharynx clear. Lymph nodes: No cervical, supraclavicular, or inguinal nodes palpable. Lungs: Breath sounds clear and equal without increased work of breathing Cardiovascular: Regular rate and rhythm without murmur. No JVD or edema. Peripheral pulses intact. Abdomen: Nondistended. Soft and nontender. No masses palpable. No organomegaly. No palpable hernias. Extremities: No edema or joint swelling or deformity. No chronic venous stasis changes. Incision right shoulder with limited extension right shoulder Neurologic: Alert and fully oriented. Gait normal.     Assessment:     New diagnosis of adenocarcinoma arising in a villous adenoma at the ileocecal valve. He will require partial colectomy. I discussed the nature of the diagnosis and treatment the patient and his wife in detail. I have recommended laparoscopic right colectomy.  We have discussed the nature of the procedure, expected recovery, possible additional treatments, and risks of surgery including anesthetic risks, bleeding, infection, anastomotic leak, and small chance for temporary ostomy. They were given complete literature regarding the procedure all their questions were answered.    Plan:     Laparoscopic partial colectomy with AM admission the following mechanical  bowel prep at home

## 2011-11-09 LAB — BASIC METABOLIC PANEL
BUN: 10 mg/dL (ref 6–23)
Calcium: 8.2 mg/dL — ABNORMAL LOW (ref 8.4–10.5)
GFR calc Af Amer: >90 mL/min (ref >90)
GFR calc non Af Amer: >90 mL/min (ref >90)
Glucose, Bld: 164 mg/dL — ABNORMAL HIGH (ref 70–99)
Potassium: 4.1 mEq/L (ref 3.5–5.1)

## 2011-11-09 LAB — CBC
MCH: 32.3 pg (ref 26.0–34.0)
MCHC: 35.7 g/dL (ref 30.0–36.0)
Platelets: 165 10*3/uL (ref 150–400)
RDW: 13.3 % (ref 11.5–15.5)

## 2011-11-09 LAB — GLUCOSE, CAPILLARY
Glucose-Capillary: 160 mg/dL — ABNORMAL HIGH (ref 70–99)
Glucose-Capillary: 178 mg/dL — ABNORMAL HIGH (ref 70–99)

## 2011-11-09 MED ORDER — ONDANSETRON HCL 4 MG/2ML IJ SOLN
4.0000 mg | Freq: Four times a day (QID) | INTRAMUSCULAR | Status: DC | PRN
  Administered 2011-11-09 – 2011-11-11 (×7): 4 mg via INTRAVENOUS
  Filled 2011-11-09 (×7): qty 2

## 2011-11-09 MED ORDER — INSULIN ASPART 100 UNIT/ML ~~LOC~~ SOLN
0.0000 [IU] | Freq: Three times a day (TID) | SUBCUTANEOUS | Status: DC
  Administered 2011-11-10 – 2011-11-11 (×4): 3 [IU] via SUBCUTANEOUS
  Administered 2011-11-11: 2 [IU] via SUBCUTANEOUS
  Administered 2011-11-11: 3 [IU] via SUBCUTANEOUS

## 2011-11-09 NOTE — Progress Notes (Signed)
Patient ID: Daniel Greer, male   DOB: Nov 05, 1950, 61 y.o.   MRN: [id number] 1 Day Post-Op  Subjective: Vomited once last night but no nausea this AM.  Had one BM with blood last night.  Having crampy abd pain  Objective: Vital signs in last 24 hours: Temp:  [97.4 F (36.3 C)-98.8 F (37.1 C)] 97.9 F (36.6 C) (03/19 0547) Pulse Rate:  [61-81] 73  (03/19 0547) Resp:  [11-20] 20  (03/19 0547) BP: (118-157)/(63-94) 120/76 mmHg (03/19 0547) SpO2:  [94 %-98 %] 95 % (03/19 0547) Weight:  [278 lb 14.4 oz (126.508 kg)] 278 lb 14.4 oz (126.508 kg) (03/18 1206) Last BM Date: 11/08/11  Intake/Output from previous day: 03/18 0701 - 03/19 0700 In: 3360 [P.O.:160; I.V.:2700; IV Piggyback:500] Out: 2350 [Urine:2125; Emesis/NG output:175; Blood:50] Intake/Output this shift:   Pt in no distreee GI: normal findings: bowel sounds normal and soft, non-tender  Lab Results:   Basename 11/09/11 0423 11/08/11 1239  WBC 9.9 9.5  HGB 12.1* 14.2  HCT 33.9* 39.0  PLT 165 160   BMET  Basename 11/09/11 0423 11/08/11 1239  NA 134* --  K 4.1 --  CL 100 --  CO2 25 --  GLUCOSE 164* --  BUN 10 --  CREATININE 0.88 0.91  CALCIUM 8.2* --   CBG (last 3)   Basename 11/09/11 0452 11/09/11 0132 11/08/11 1954  GLUCAP 154* 160* 153*     Studies/Results: No results found.  Anti-infectives: Anti-infectives     Start     Dose/Rate Route Frequency Ordered Stop   11/08/11 0652   ertapenem [facility]) 1 g in sodium chloride 0.9 % 50 mL IVPB        1 g 100 mL/hr over 30 Minutes Intravenous 60 min pre-op 11/08/11 0652 11/08/11 0755          Assessment/Plan: s/p Procedure(s): LAPAROSCOPIC PARTIAL COLECTOMY Stable post op. Offer CL diet.  Follow Hbg   LOS: 1 day    [name] T 11/09/2011

## 2011-11-10 LAB — CBC
HCT: 34 % — ABNORMAL LOW (ref 39.0–52.0)
Hemoglobin: 12.1 g/dL — ABNORMAL LOW (ref 13.0–17.0)
MCH: 32.2 pg (ref 26.0–34.0)
MCV: 90.4 fL (ref 78.0–100.0)
Platelets: 167 10*3/uL (ref 150–400)
RBC: 3.76 MIL/uL — ABNORMAL LOW (ref 4.22–5.81)
WBC: 8 10*3/uL (ref 4.0–10.5)

## 2011-11-10 LAB — GLUCOSE, CAPILLARY: Glucose-Capillary: 159 mg/dL — ABNORMAL HIGH (ref 70–99)

## 2011-11-10 NOTE — Progress Notes (Signed)
Patient ID: Daniel Greer, male   DOB: 1951/05/15, 61 y.o.   MRN: [id number] 2 Days Post-Op  Subjective: Had sever cramping "gas pains" and rumbling last night.  Better with pain meds. No flatus yet. Naseated last night but no nausea this AM  Objective: Vital signs in last 24 hours: Temp:  [97.9 F (36.6 C)-98.7 F (37.1 C)] 98.6 F (37 C) (03/20 0635) Pulse Rate:  [83-99] 88  (03/20 0635) Resp:  [18-20] 20  (03/20 0635) BP: (117-149)/(72-88) 149/85 mmHg (03/20 0635) SpO2:  [94 %-97 %] 96 % (03/20 0635) Last BM Date: 11/08/11  Intake/Output from previous day: 03/19 0701 - 03/20 0700 In: 5513.3 [P.O.:1150; I.V.:4363.3] Out: 3800 [Urine:3800] Intake/Output this shift:    General appearance: alert and no distress GI: normal findings: bowel sounds normal and soft, non-tender, non distended Incision/Wound: Clean and dry  Lab Results:   Basename 11/10/11 0429 11/09/11 0423  WBC 8.0 9.9  HGB 12.1* 12.1*  HCT 34.0* 33.9*  PLT 167 165   BMET  Basename 11/09/11 0423 11/08/11 1239  NA 134* --  K 4.1 --  CL 100 --  CO2 25 --  GLUCOSE 164* --  BUN 10 --  CREATININE 0.88 0.91  CALCIUM 8.2* --   pathology No residual Ca identified, all node neg  Studies/Results: No results found.  Anti-infectives: Anti-infectives     Start     Dose/Rate Route Frequency Ordered Stop   11/08/11 0652   ertapenem [facility]) 1 g in sodium chloride 0.9 % 50 mL IVPB        1 g 100 mL/hr over 30 Minutes Intravenous 60 min pre-op 11/08/11 0652 11/08/11 0755          Assessment/Plan: s/p Procedure(s): LAPAROSCOPIC PARTIAL COLECTOMY Stable, still some ileus.  Abdomen seems benign. Ambulate. Continue CL only for now Discussed favorable path report with patient  LOS: 2 days    Daniel Greer T 11/10/2011

## 2011-11-11 LAB — GLUCOSE, CAPILLARY
Glucose-Capillary: 144 mg/dL — ABNORMAL HIGH (ref 70–99)
Glucose-Capillary: 179 mg/dL — ABNORMAL HIGH (ref 70–99)

## 2011-11-11 MED ORDER — TRAMADOL HCL 50 MG PO TABS: 50.0000 mg | ORAL_TABLET | Freq: Four times a day (QID) | ORAL | Status: DC | PRN

## 2011-11-11 NOTE — Progress Notes (Signed)
Patient ID: Daniel Greer, male   DOB: 25-Jan-1951, 61 y.o.   MRN: [id number] 3 Days Post-Op  Subjective: Feels much better today. Only mild incisional pain controlled with medications. No nausea and tolerating clear liquids.  Objective: Vital signs in last 24 hours: Temp:  [97.9 F (36.6 C)-98.7 F (37.1 C)] 97.9 F (36.6 C) (03/21 0600) Pulse Rate:  [70-87] 70  (03/21 0600) Resp:  [19-20] 19  (03/21 0600) BP: (124-136)/(72-77) 136/72 mmHg (03/21 0600) SpO2:  [96 %-98 %] 98 % (03/21 0600) Last BM Date: 11/10/11  Intake/Output from previous day: 03/20 0701 - 03/21 0700 In: -  Out: 401 [Urine:400; Stool:1] Intake/Output this shift: Total I/O In: -  Out: 400 [Urine:400]  General appearance: alert and no distress GI: normal findings: soft, non-tender Incision/Wound: clean and dry without evidence of infection  Lab Results:   Basename 11/11/11 0430 11/10/11 0429 11/09/11 0423  WBC -- 8.0 9.9  HGB 11.9* 12.1* --  HCT 33.6* 34.0* --  PLT -- 167 165   BMET  Basename 11/09/11 0423 11/08/11 1239  NA 134* --  K 4.1 --  CL 100 --  CO2 25 --  GLUCOSE 164* --  BUN 10 --  CREATININE 0.88 0.91  CALCIUM 8.2* --     Studies/Results: No results found.  Anti-infectives: Anti-infectives     Start     Dose/Rate Route Frequency Ordered Stop   11/08/11 0652   ertapenem [facility]) 1 g in sodium chloride 0.9 % 50 mL IVPB        1 g 100 mL/hr over 30 Minutes Intravenous 60 min pre-op 11/08/11 4098 11/08/11 0755          Assessment/Plan: s/p Procedure(s): LAPAROSCOPIC PARTIAL COLECTOMY Doing well postoperatively. We'll advance diet. Anticipate discharge tomorrow.   LOS: 3 days    Davyn Elsasser T 11/11/2011

## 2011-11-12 LAB — GLUCOSE, CAPILLARY
Glucose-Capillary: 147 mg/dL — ABNORMAL HIGH (ref 70–99)
Glucose-Capillary: 152 mg/dL — ABNORMAL HIGH (ref 70–99)

## 2011-11-12 MED ORDER — OXYCODONE-ACETAMINOPHEN 5-325 MG PO TABS: 1.0000 | ORAL_TABLET | ORAL | Status: AC | PRN

## 2011-11-12 NOTE — Discharge Summary (Signed)
   Patient ID: Daniel Greer [id number] 60 y.o. 1951-02-03  11/08/2011  Discharge date and time: 11/12/2011   Admitting Physician: Glenna Fellows T  Discharge Physician: Glenna Fellows T  Admission Diagnoses: cancer of ascending colon   Discharge Diagnoses: the same  Operations: Procedure(s): LAPAROSCOPIC PARTIAL COLECTOMY  Admission Condition: good  Discharged Condition: good  Indication for Admission: patient is a 61 year old male who recently underwent his initial screening colonoscopy. An approximately 3 cm polyp was removed at the ileocecal valve. This revealed invasive adenocarcinoma. Partial colectomy was recommended and he is admitted for this procedure.  Hospital Course: patient was admitted on the morning of the procedure after a mechanical bowel prep at home. He underwent an uneventful laparoscopic right hemicolectomy. His postoperative course was very smooth without complication. He was started on clear liquids on the first postoperative day. He had some cramping pain for about 24 hours but then began to have bowel movements and his diet was able to be advanced without nausea. At the time of discharge she is tolerating a regular diet, denies nausea or abdominal pain. His wound is healing well. Final pathology showed no residual tumor and 17 negative lymph nodes.  Disposition: Home  Patient Instructions:   [name], [name]  Home Medication Instructions WUJ:[id number]   Printed on:11/12/11 0747  Medication Information                    metFORMIN (GLUCOPHAGE) 500 MG tablet Take 500 mg by mouth 2 (two) times daily with a meal.           benazepril (LOTENSIN) 20 MG tablet Take 20 mg by mouth daily at 12 noon.            loratadine (CLARITIN) 10 MG tablet Take 10 mg by mouth every morning.            diphenhydramine-acetaminophen (TYLENOL PM) 25-500 MG TABS Take 1 tablet by mouth at bedtime as needed. Sleep           DM-Phenylephrine-Acetaminophen  (ALKA-SELTZER PLS SINUS & COUGH PO) Take 2 tablets by mouth every 4 (four) hours as needed. Cold            oxyCODONE-acetaminophen (PERCOCET) 5-325 MG per tablet Take 1-2 tablets by mouth every 4 (four) hours as needed.             Activity: no heavy lifting for 3 weeks Diet: regular diet Wound Care: none needed  Follow-up:  With Dr. Johna Sheriff in 2 weeks.  Signed: Mariella Saa MD, FACS  11/12/2011, 7:47 AM

## 2011-11-12 NOTE — Discharge Instructions (Signed)
[facility]      [facility] [phone]  OPEN ABDOMINAL SURGERY: POST OP INSTRUCTIONS  Always review your discharge instruction sheet given to you by the facility where your surgery was performed.  IF YOU HAVE DISABILITY OR FAMILY LEAVE FORMS, YOU MUST BRING THEM TO THE OFFICE FOR PROCESSING.  PLEASE DO NOT GIVE THEM TO YOUR DOCTOR.  1. A prescription for pain medication may be given to you upon discharge.  Take your pain medication as prescribed, if needed.  If narcotic pain medicine is not needed, then you may take acetaminophen (Tylenol) or ibuprofen (Advil) as needed. 2. Take your usually prescribed medications unless otherwise directed. 3. If you need a refill on your pain medication, please contact your pharmacy. They will contact our office to request authorization.  Prescriptions will not be filled after 5pm or on week-ends. 4. You should follow a light diet the first few days after arrival home, such as soup and crackers, pudding, etc.unless your doctor has advised otherwise. A high-fiber, low fat diet can be resumed as tolerated.   Be sure to include lots of fluids daily. Most patients will experience some swelling and bruising on the chest and neck area.  Ice packs will help.  Swelling and bruising can take several days to resolve 5. Most patients will experience some swelling and bruising in the area of the incision. Ice pack will help. Swelling and bruising can take several days to resolve..  6. It is common to experience some constipation if taking pain medication after surgery.  Increasing fluid intake and taking a stool softener will usually help or prevent this problem from occurring.  A mild laxative (Milk of Magnesia or Miralax) should be taken according to package directions if there are no bowel movements after 48 hours. 7.  You may have steri-strips (small skin tapes) in place directly over the incision.  These strips should be left on the skin for 7-10 days.  If your  surgeon used skin glue on the incision, you may shower in 24 hours.  The glue will flake off over the next 2-3 weeks.  Any sutures or staples will be removed at the office during your follow-up visit. You may find that a light gauze bandage over your incision may keep your staples from being rubbed or pulled. You may shower and replace the bandage daily. 8. ACTIVITIES:  You may resume regular (light) daily activities beginning the next day--such as daily self-care, walking, climbing stairs--gradually increasing activities as tolerated.  You may have sexual intercourse when it is comfortable.  Refrain from any heavy lifting or straining until approved by your doctor. a. You may drive when you no longer are taking prescription pain medication, you can comfortably wear a seatbelt, and you can safely maneuver your car and apply brakes b. Return to Work: ___________________________________ 9. You should see your doctor in the office for a follow-up appointment approximately two weeks after your surgery.  Make sure that you call for this appointment within a day or two after you arrive home to insure a convenient appointment time. OTHER INSTRUCTIONS:  _____________________________________________________________ _____________________________________________________________  WHEN TO CALL YOUR DOCTOR: 1. Fever over 101.0 2. Inability to urinate 3. Nausea and/or vomiting 4. Extreme swelling or bruising 5. Continued bleeding from incision. 6. Increased pain, redness, or drainage from the incision. 7. Difficulty swallowing or breathing 8. Muscle cramping or spasms. 9. Numbness or tingling in hands or feet or around lips.  The clinic staff is available to   answer your questions during regular business hours.  Please don't hesitate to call and ask to speak to one of the nurses if you have concerns.  For further questions, please visit [email or web address]   

## 2011-11-12 NOTE — Progress Notes (Signed)
Assessment unchanged from previous. States understanding of discharge instructions and iv removed. Left floor via wheelchair

## 2011-11-12 NOTE — Progress Notes (Signed)
Patient ID: Daniel Greer, male   DOB: December 20, 1950, 61 y.o.   MRN: [id number] 4 Days Post-Op  Subjective: Feels "great". Tolerating regular diet without difficulty. Having some loose bowel movements.  Objective: Vital signs in last 24 hours: Temp:  [98 F (36.7 C)-98.4 F (36.9 C)] 98.4 F (36.9 C) (03/22 0545) Pulse Rate:  [68-82] 68  (03/22 0545) Resp:  [18-20] 18  (03/22 0545) BP: (113-137)/(67-76) 130/72 mmHg (03/22 0545) SpO2:  [96 %-99 %] 97 % (03/22 0545) Last BM Date: 11/10/11  Intake/Output from previous day: 03/21 0701 - 03/22 0700 In: 1588.6 [P.O.:782; I.V.:806.6] Out: 2275 [Urine:2275] Intake/Output this shift:    General appearance: alert and no distress GI: normal findings: soft, non-tender Incision/Wound: clean and dry without signs of infection  Lab Results:   Basename 11/11/11 0430 11/10/11 0429  WBC -- 8.0  HGB 11.9* 12.1*  HCT 33.6* 34.0*  PLT -- 167   BMET No results found for this basename: NA:2,K:2,CL:2,CO2:2,GLUCOSE:2,BUN:2,CREATININE:2,CALCIUM:2 in the last 72 hours   Studies/Results: No results found.  Anti-infectives: Anti-infectives     Start     Dose/Rate Route Frequency Ordered Stop   11/08/11 0652   ertapenem [facility]) 1 g in sodium chloride 0.9 % 50 mL IVPB        1 g 100 mL/hr over 30 Minutes Intravenous 60 min pre-op 11/08/11 0102 11/08/11 0755          Assessment/Plan: s/p Procedure(s): LAPAROSCOPIC PARTIAL COLECTOMY Doing very well without complication identified. Ready for discharge.   LOS: 4 days    Ayven Glasco T 11/12/2011

## 2011-11-25 ENCOUNTER — Encounter (INDEPENDENT_AMBULATORY_CARE_PROVIDER_SITE_OTHER): Payer: Self-pay | Admitting: General Surgery

## 2011-11-25 ENCOUNTER — Ambulatory Visit (INDEPENDENT_AMBULATORY_CARE_PROVIDER_SITE_OTHER): Payer: PRIVATE HEALTH INSURANCE | Admitting: General Surgery

## 2011-11-25 VITALS — BP 130/84 | HR 80 | Temp 97.6°F | Resp 16 | Ht 76.0 in | Wt 271.0 lb

## 2011-11-25 DIAGNOSIS — C182 Malignant neoplasm of ascending colon: Secondary | ICD-10-CM

## 2011-11-25 NOTE — Progress Notes (Signed)
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 He is doing very well. He has no abdominal or GI complaints. Energy is good. Eating well. He is ready to go back to work.  Exam: BP 130/84  Pulse 80  Temp(Src) 97.6 F (36.4 C) (Temporal)  Resp 16  Ht 6\' 4"  (1.93 m)  Wt 271 lb (122.925 kg)  BMI 32.99 kg/m2  Gen.: Appears well Abdomen: Soft and nontender. Wounds well healed.  Pathology which I had previously reviewed with the patient and his wife happily showed only post biopsy site changes and no residual carcinoma and all nodes negative  Assessment and plan: Doing very well following laparoscopic hemicolectomy for malignant polyp.Pathologic stage TI N0. He will not require adjuvant treatment. I think at this point just followup colonoscopy by Dr. Marina Goodell as recommended is adequate. He is discharged from my care and return as needed.

## 2012-09-12 ENCOUNTER — Encounter: Payer: Self-pay | Admitting: *Deleted

## 2012-10-02 ENCOUNTER — Encounter: Payer: Self-pay | Admitting: Internal Medicine

## 2012-11-01 IMAGING — CR DG ORBITS FOR FOREIGN BODY
2 series · 2 of 2 positions shown · non-contrast
Comparison: None.

CLINICAL DATA: Pre MRI, exposure to metal.

ORBITS FOR FOREIGN BODY - 2 VIEW

[view not recorded (1 of 2)]
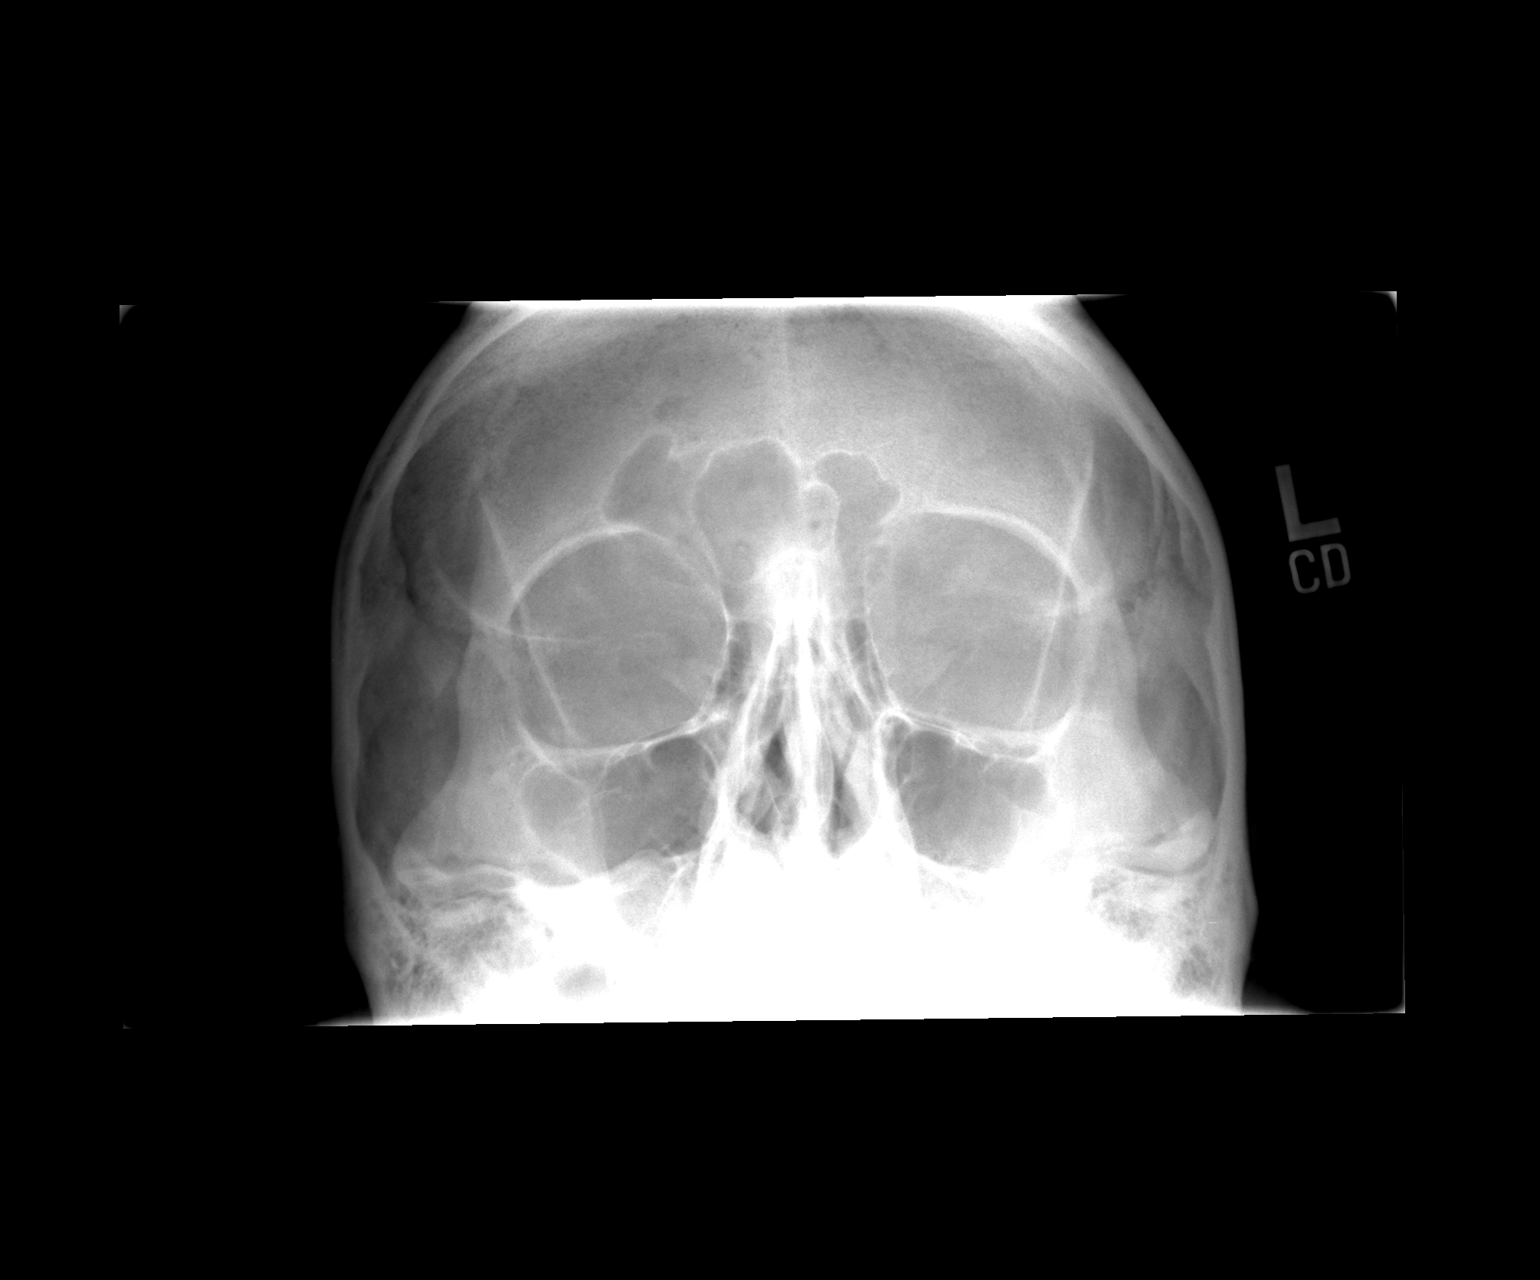

[view not recorded (2 of 2)]
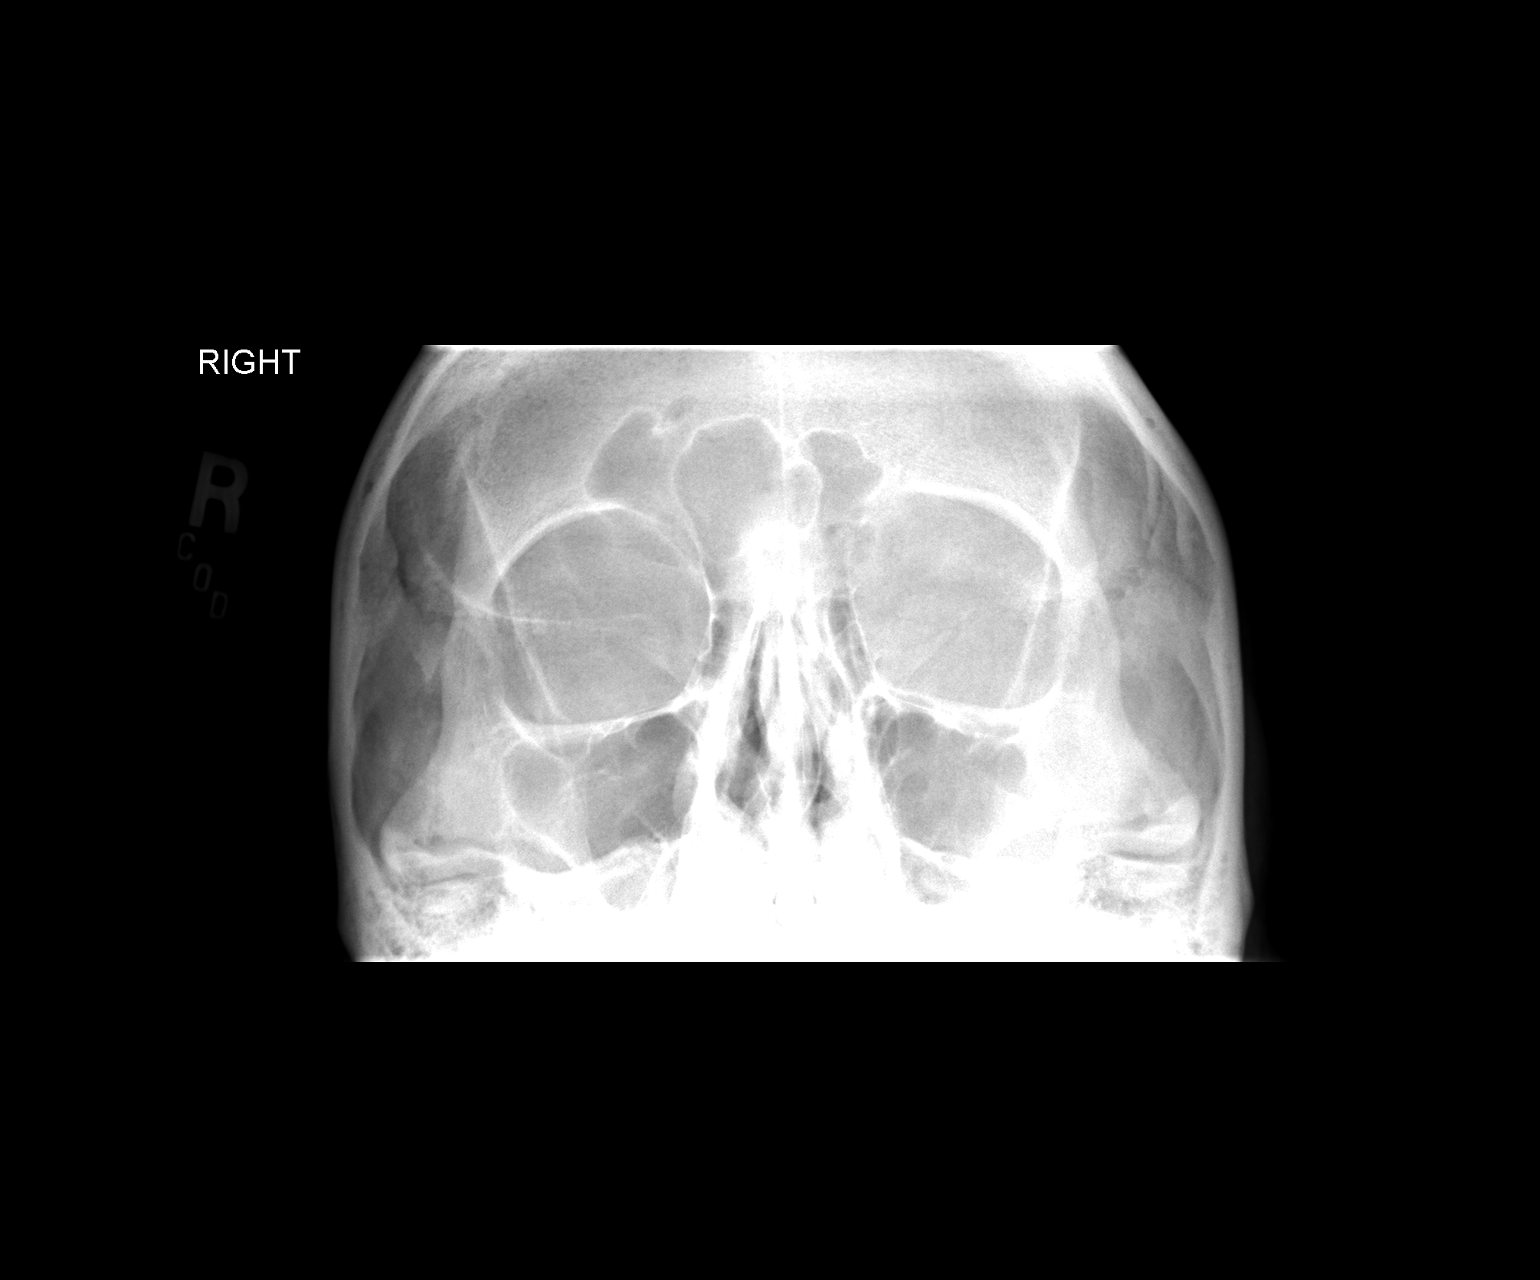

[2 of 2 positions shown; findings below may reference images not displayed]

FINDINGS: No orbital metallic foreign body.
IMPRESSION: No orbital metallic foreign body.

## 2012-11-01 IMAGING — RF DG FLUORO GUIDE NDL PLC/BX
3 series · 3 of 3 positions shown · IV contrast (omnipaque)
Comparison: none

CLINICAL DATA: History of posterior dislocation.  Persistent pain.

RIGHT SHOULDER INJECTION UNDER FLUOROSCOPY FOR MRI
TECHNIQUE: An appropriate skin entry site was determined under
fluoroscopy. Skin site was marked, prepped with Betadine, and
draped in usual sterile fashion, and infiltrated locally with 1%
lidocaine.
22-gauge spinal needle advanced to the superior medial margin of
the humeral head. 1 mL of lidocaine 1% injected easily. 11ml of a
mixture of 5ml lidocaine 1%, 5ml saline, and 10ml Omnipaque 180
with 0.1ml Multihance contrast was injected into the shoulder
joint. Intraarticular flow was confirmed on fluoroscopy. Patient
transferred to MRI.
Fluoroscopy time:  43seconds
IMPRESSION
1. Technically successful right shoulder injection for MRI

[Series 1: (hospital) · 1 of 1 slices shown (1 of 2)]
[im 1/1]
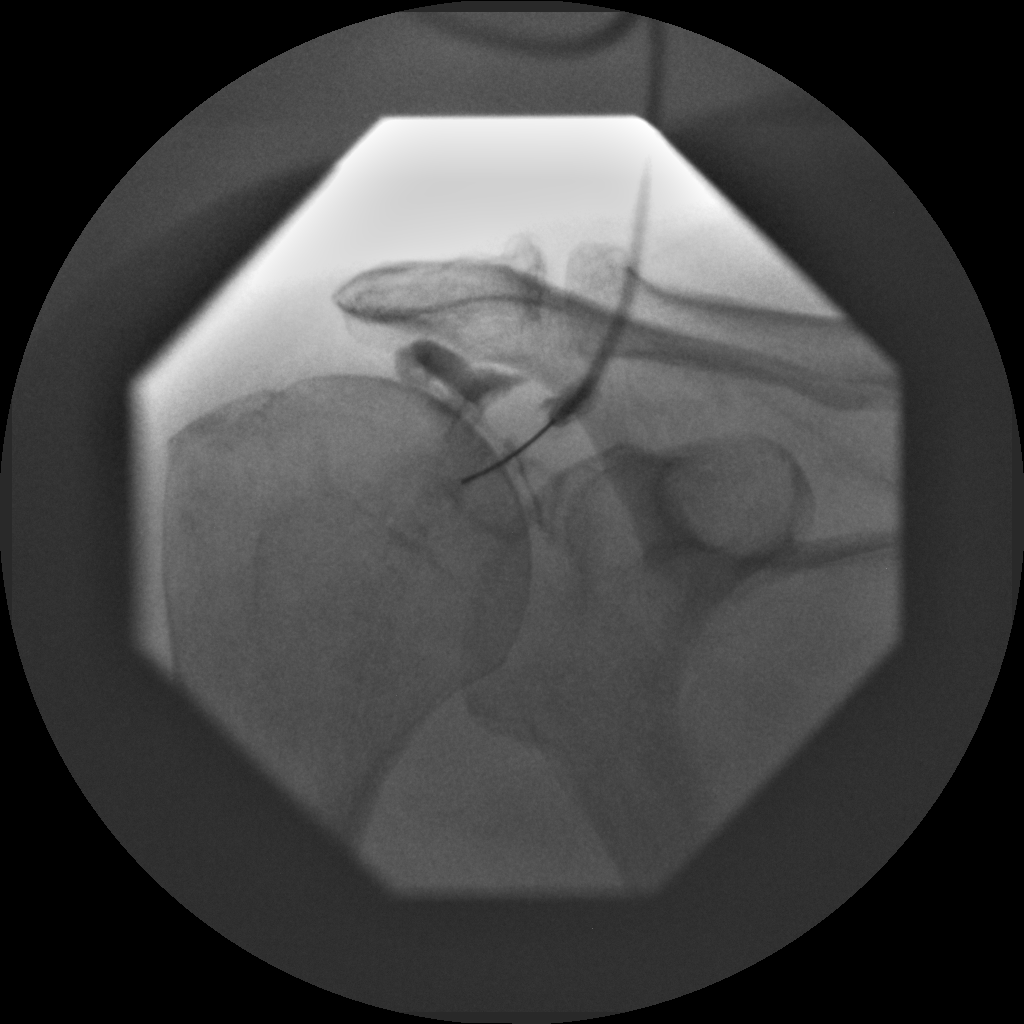

[Series 2: (hospital) · 1 of 1 slices shown (2 of 2)]
[im 1/1]
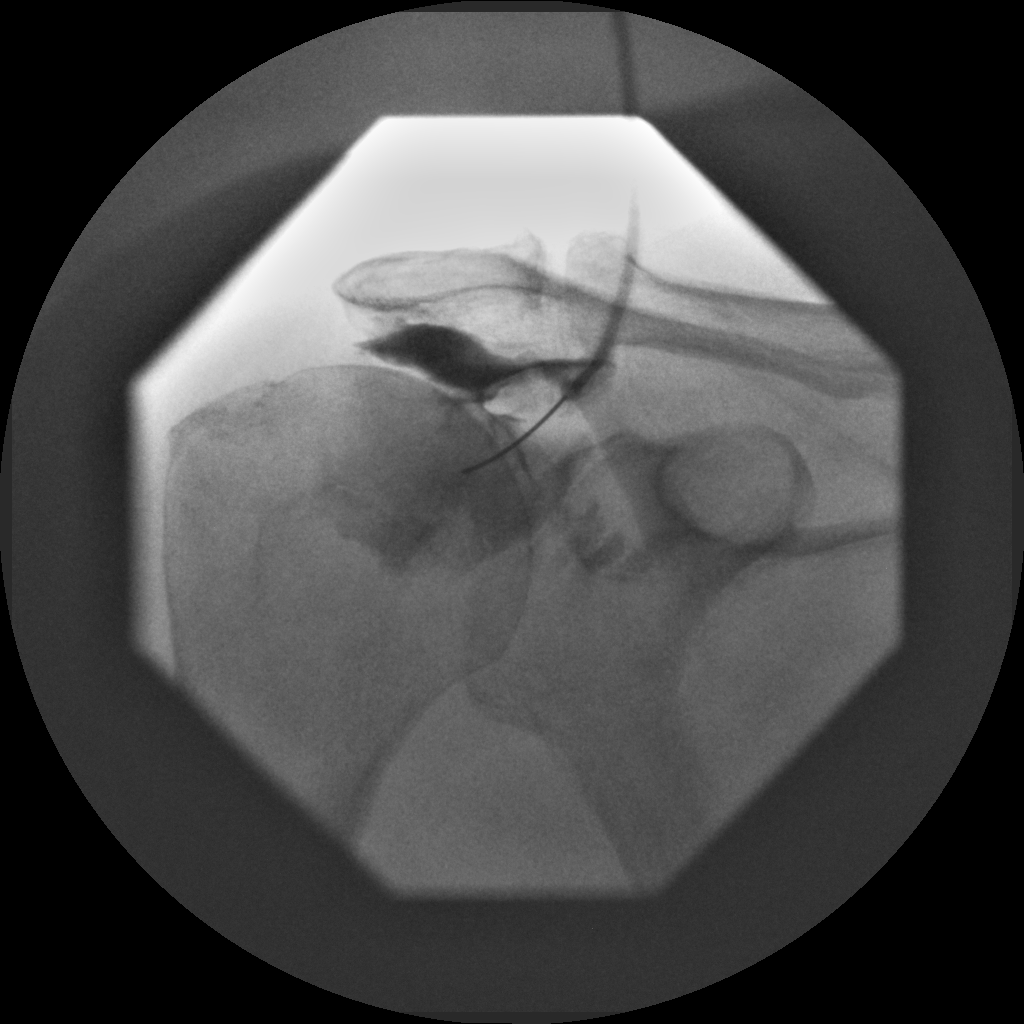

[Series 3: arthrogram · 1 of 1 slices shown]
[im 1/1]
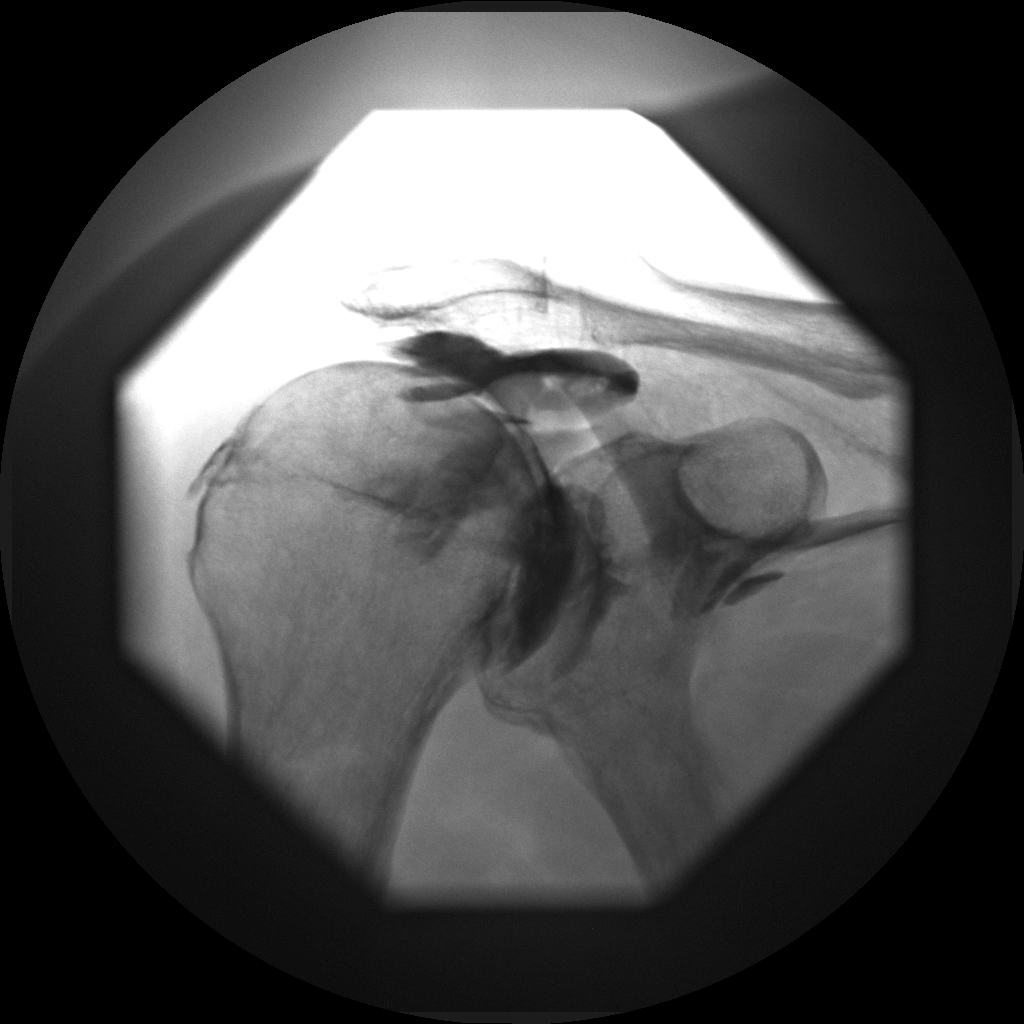

[3 of 3 positions shown; findings below may reference images not displayed]

## 2013-04-16 ENCOUNTER — Encounter: Payer: Self-pay | Admitting: Internal Medicine

## 2015-01-21 ENCOUNTER — Encounter: Payer: Self-pay | Admitting: Internal Medicine

## 2015-01-27 ENCOUNTER — Encounter: Payer: PRIVATE HEALTH INSURANCE | Admitting: Internal Medicine

## 2015-02-10 ENCOUNTER — Ambulatory Visit (AMBULATORY_SURGERY_CENTER): Payer: Self-pay

## 2015-02-10 VITALS — Ht 76.0 in | Wt 275.0 lb

## 2015-02-10 DIAGNOSIS — Z85038 Personal history of other malignant neoplasm of large intestine: Secondary | ICD-10-CM

## 2015-02-10 NOTE — Progress Notes (Signed)
No diet drugs  Not on home 02 No egg or soy allergies No complications from anesthesia

## 2015-02-17 ENCOUNTER — Ambulatory Visit (AMBULATORY_SURGERY_CENTER): Payer: BLUE CROSS/BLUE SHIELD | Admitting: Internal Medicine

## 2015-02-17 ENCOUNTER — Encounter: Payer: Self-pay | Admitting: Internal Medicine

## 2015-02-17 VITALS — BP 125/82 | HR 75 | Temp 97.5°F | Resp 16 | Ht 76.0 in | Wt 275.0 lb

## 2015-02-17 DIAGNOSIS — Z85038 Personal history of other malignant neoplasm of large intestine: Secondary | ICD-10-CM | POA: Diagnosis present

## 2015-02-17 DIAGNOSIS — D124 Benign neoplasm of descending colon: Secondary | ICD-10-CM

## 2015-02-17 LAB — GLUCOSE, CAPILLARY
Glucose-Capillary: 136 mg/dL — ABNORMAL HIGH (ref 65–99)
Glucose-Capillary: 117 mg/dL — ABNORMAL HIGH (ref 65–99)

## 2015-02-17 MED ORDER — SODIUM CHLORIDE 0.9 % IV SOLN: 500.0000 mL | INTRAVENOUS | Status: DC

## 2015-02-17 NOTE — Patient Instructions (Signed)
Discharge instructions given. Handout on polyps. Resume previous medications. YOU HAD AN ENDOSCOPIC PROCEDURE TODAY AT THE [facility]:   Refer to the procedure report that was given to you for any specific questions about what was found during the examination.  If the procedure report does not answer your questions, please call your gastroenterologist to clarify.  If you requested that your care partner not be given the details of your procedure findings, then the procedure report has been included in a sealed envelope for you to review at your convenience later.  YOU SHOULD EXPECT: Some feelings of bloating in the abdomen. Passage of more gas than usual.  Walking can help get rid of the air that was put into your GI tract during the procedure and reduce the bloating. If you had a lower endoscopy (such as a colonoscopy or flexible sigmoidoscopy) you may notice spotting of blood in your stool or on the toilet paper. If you underwent a bowel prep for your procedure, you may not have a normal bowel movement for a few days.  Please Note:  You might notice some irritation and congestion in your nose or some drainage.  This is from the oxygen used during your procedure.  There is no need for concern and it should clear up in a day or so.  SYMPTOMS TO REPORT IMMEDIATELY:   Following lower endoscopy (colonoscopy or flexible sigmoidoscopy):  Excessive amounts of blood in the stool  Significant tenderness or worsening of abdominal pains  Swelling of the abdomen that is new, acute  Fever of 100F or higher   For urgent or emergent issues, a gastroenterologist can be reached at any hour by calling [phone].   DIET: Your first meal following the procedure should be a small meal and then it is ok to progress to your normal diet. Heavy or fried foods are harder to digest and may make you feel nauseous or bloated.  Likewise, meals heavy in dairy and vegetables can increase bloating.  Drink  plenty of fluids but you should avoid alcoholic beverages for 24 hours.  ACTIVITY:  You should plan to take it easy for the rest of today and you should NOT DRIVE or use heavy machinery until tomorrow (because of the sedation medicines used during the test).    FOLLOW UP: Our staff will call the number listed on your records the next business day following your procedure to check on you and address any questions or concerns that you may have regarding the information given to you following your procedure. If we do not reach you, we will leave a message.  However, if you are feeling well and you are not experiencing any problems, there is no need to return our call.  We will assume that you have returned to your regular daily activities without incident.  If any biopsies were taken you will be contacted by phone or by letter within the next 1-3 weeks.  Please call us at [phone] if you have not heard about the biopsies in 3 weeks.    SIGNATURES/CONFIDENTIALITY: You and/or your care partner have signed paperwork which will be entered into your electronic medical record.  These signatures attest to the fact that that the information above on your After Visit Summary has been reviewed and is understood.  Full responsibility of the confidentiality of this discharge information lies with you and/or your care-partner. 

## 2015-02-17 NOTE — Op Note (Signed)
[place]  [address] [place], [address]   COLONOSCOPY PROCEDURE REPORT  PATIENT: Daniel Greer, Daniel Greer  MR#: [id number] BIRTHDATE: September 12, 1950 , 63  yrs. old GENDER: male ENDOSCOPIST: Eustace Quail, MD REFERRED PQ:[id number] Program Recall PROCEDURE DATE:  02/17/2015 PROCEDURE:   Colonoscopy, surveillance and Colonoscopy with snare polypectomyx 1 First Screening Colonoscopy - Avg.  risk and is 50 yrs.  old or older - No.  Prior Negative Screening - Now for repeat screening. N/A  History of Adenoma - Now for follow-up colonoscopy & has been > or = to 3 yrs.  N/A  Polyps removed today? Yes ASA CLASS:   Class II INDICATIONS:Surveillance due to prior colonic neoplasia and PH Colon or Rectal Adenocarcinoma. Index colonoscopy January 2013 with small and large polyps removed from the ileocecal valve. Larger polyp with invasive adenocarcinoma. Subsequent surgical resection in that region negative for residual cancer as well as negative lymph nodes. Overdue for follow-up. MEDICATIONS: Monitored anesthesia care and Propofol 200 mg IV  DESCRIPTION OF PROCEDURE:   After the risks benefits and alternatives of the procedure were thoroughly explained, informed consent was obtained.  The digital rectal exam revealed no abnormalities of the rectum.   The LB [id number]  endoscope was introduced through the anus and advanced to the surgical anastomosis. No adverse events experienced.   The quality of the prep was excellent.  (MiraLax was used)  The instrument was then slowly withdrawn as the colon was fully examined. Estimated blood loss is zero unless otherwise noted in this procedure report.      COLON FINDINGS: There was evidence of a prior ileocolonic surgical anastomosis in the most proximal right colon.   A single polyp measuring 4 mm in size was found in the descending colon.  A polypectomy was performed with a cold snare.  The resection was complete, the  polyp tissue was completely retrieved and sent to histology.   The examination was otherwise normal.  Retroflexed views revealed internal hemorrhoids. The time to cecum = 2.0 Withdrawal time = 10.2   The scope was withdrawn and the procedure completed. COMPLICATIONS: There were no immediate complications.  ENDOSCOPIC IMPRESSION: 1.   There was evidence of a prior ileocolonic surgical anastomosis 2.   Single polyp was found in the descending colon; polypectomy was performed with a cold snare 3.   The examination was otherwise normal  RECOMMENDATIONS:1. 1. Repeat Colonoscopy in 3 years.  eSigned:  Eustace Quail, MD 02/17/2015 3:46 PM   cc: The Patient and Domenick Gong, MD

## 2015-02-17 NOTE — Progress Notes (Signed)
Report to PACU, RN, vss, BBS= Clear.  

## 2015-02-17 NOTE — Progress Notes (Signed)
Called to room to assist during endoscopic procedure.  Patient ID and intended procedure confirmed with present staff. Received instructions for my participation in the procedure from the performing physician.  

## 2015-02-18 ENCOUNTER — Telehealth: Payer: Self-pay | Admitting: Emergency Medicine

## 2015-02-18 NOTE — Telephone Encounter (Signed)
No identifier, left message to f/u 

## 2015-02-20 ENCOUNTER — Encounter: Payer: Self-pay | Admitting: Internal Medicine

## 2017-10-27 DIAGNOSIS — I10 Essential (primary) hypertension: Secondary | ICD-10-CM | POA: Diagnosis not present

## 2017-10-27 DIAGNOSIS — Z125 Encounter for screening for malignant neoplasm of prostate: Secondary | ICD-10-CM | POA: Diagnosis not present

## 2017-10-27 DIAGNOSIS — E119 Type 2 diabetes mellitus without complications: Secondary | ICD-10-CM | POA: Diagnosis not present

## 2017-10-27 DIAGNOSIS — E7800 Pure hypercholesterolemia, unspecified: Secondary | ICD-10-CM | POA: Diagnosis not present

## 2017-10-27 DIAGNOSIS — R82998 Other abnormal findings in urine: Secondary | ICD-10-CM | POA: Diagnosis not present

## 2017-11-03 DIAGNOSIS — M1990 Unspecified osteoarthritis, unspecified site: Secondary | ICD-10-CM | POA: Diagnosis not present

## 2017-11-03 DIAGNOSIS — D692 Other nonthrombocytopenic purpura: Secondary | ICD-10-CM | POA: Diagnosis not present

## 2017-11-03 DIAGNOSIS — Z85038 Personal history of other malignant neoplasm of large intestine: Secondary | ICD-10-CM | POA: Diagnosis not present

## 2017-11-03 DIAGNOSIS — I10 Essential (primary) hypertension: Secondary | ICD-10-CM | POA: Diagnosis not present

## 2017-11-03 DIAGNOSIS — E7800 Pure hypercholesterolemia, unspecified: Secondary | ICD-10-CM | POA: Diagnosis not present

## 2017-11-03 DIAGNOSIS — E668 Other obesity: Secondary | ICD-10-CM | POA: Diagnosis not present

## 2017-11-03 DIAGNOSIS — N528 Other male erectile dysfunction: Secondary | ICD-10-CM | POA: Diagnosis not present

## 2017-11-03 DIAGNOSIS — Z0000 Encounter for general adult medical examination without abnormal findings: Secondary | ICD-10-CM | POA: Diagnosis not present

## 2017-11-03 DIAGNOSIS — E119 Type 2 diabetes mellitus without complications: Secondary | ICD-10-CM | POA: Diagnosis not present

## 2017-11-03 DIAGNOSIS — D126 Benign neoplasm of colon, unspecified: Secondary | ICD-10-CM | POA: Diagnosis not present

## 2017-11-03 DIAGNOSIS — Z23 Encounter for immunization: Secondary | ICD-10-CM | POA: Diagnosis not present

## 2017-11-10 DIAGNOSIS — Z1212 Encounter for screening for malignant neoplasm of rectum: Secondary | ICD-10-CM | POA: Diagnosis not present

## 2017-12-24 ENCOUNTER — Emergency Department: Payer: Medicare HMO

## 2017-12-24 ENCOUNTER — Emergency Department
Admission: EM | Admit: 2017-12-24 | Discharge: 2017-12-24 | Disposition: A | Discharge status: Home / Self Care | Payer: Medicare HMO | Attending: Emergency Medicine | Admitting: Emergency Medicine

## 2017-12-24 ENCOUNTER — Encounter: Payer: Self-pay | Admitting: Emergency Medicine

## 2017-12-24 ENCOUNTER — Other Ambulatory Visit: Payer: Self-pay

## 2017-12-24 DIAGNOSIS — Z85038 Personal history of other malignant neoplasm of large intestine: Secondary | ICD-10-CM | POA: Diagnosis not present

## 2017-12-24 DIAGNOSIS — I10 Essential (primary) hypertension: Secondary | ICD-10-CM | POA: Diagnosis not present

## 2017-12-24 DIAGNOSIS — Z79899 Other long term (current) drug therapy: Secondary | ICD-10-CM | POA: Diagnosis not present

## 2017-12-24 DIAGNOSIS — Z7984 Long term (current) use of oral hypoglycemic drugs: Secondary | ICD-10-CM | POA: Diagnosis not present

## 2017-12-24 DIAGNOSIS — E119 Type 2 diabetes mellitus without complications: Secondary | ICD-10-CM | POA: Diagnosis not present

## 2017-12-24 DIAGNOSIS — M545 Low back pain: Secondary | ICD-10-CM | POA: Diagnosis not present

## 2017-12-24 DIAGNOSIS — M5442 Lumbago with sciatica, left side: Secondary | ICD-10-CM | POA: Diagnosis not present

## 2017-12-24 MED ORDER — PREDNISONE 10 MG (21) PO TBPK: ORAL_TABLET | ORAL | 0 refills | Status: DC

## 2017-12-24 MED ORDER — DIAZEPAM 2 MG PO TABS
2.0000 mg | ORAL_TABLET | Freq: Once | ORAL | Status: AC
  Administered 2017-12-24: 2 mg via ORAL
  Filled 2017-12-24: qty 1

## 2017-12-24 MED ORDER — METHYLPREDNISOLONE SODIUM SUCC 125 MG IJ SOLR
125.0000 mg | Freq: Once | INTRAMUSCULAR | Status: AC
Start: 2017-12-24 — End: 2017-12-24
  Administered 2017-12-24: 125 mg via INTRAMUSCULAR
  Filled 2017-12-24: qty 2

## 2017-12-24 MED ORDER — LIDOCAINE 5 % EX PTCH: 1.0000 | MEDICATED_PATCH | Freq: Two times a day (BID) | CUTANEOUS | 0 refills | Status: AC

## 2017-12-24 MED ORDER — LIDOCAINE 5 % EX PTCH
1.0000 | MEDICATED_PATCH | CUTANEOUS | Status: DC
  Administered 2017-12-24: 1 via TRANSDERMAL
  Filled 2017-12-24: qty 1

## 2017-12-24 MED ORDER — DIAZEPAM 2 MG PO TABS: 2.0000 mg | ORAL_TABLET | Freq: Four times a day (QID) | ORAL | 0 refills | Status: DC | PRN

## 2017-12-24 NOTE — ED Notes (Signed)
Pt verbalizes understanding of discharge isntructions

## 2017-12-24 NOTE — ED Triage Notes (Signed)
Pt arrives POV to triage with c/o lower left lower back pain which radiates down his left leg x 2 weeks. Pt reports that he lifted a heavy object before the pain started and the discomfort has become progressively worse. Pt is in NAD.

## 2017-12-24 NOTE — Discharge Instructions (Addendum)
Please follow-up with your primary care physician or with your orthopedic surgeon.  Please make sure you are resting this weekend and not doing anything else that might agitate your back.  Please return with any worsening condition or any other concerns.

## 2017-12-24 NOTE — ED Provider Notes (Signed)
[facility] Provider Note   ____________________________________________   First MD Initiated Contact with Patient 12/24/17 [phone]     (approximate)  I have reviewed the triage vital signs and the nursing notes.   HISTORY  Chief Complaint Back Pain    HPI Daniel Greer is a 67 y.o. male who comes into the hospital today with some back pain for a couple of weeks.  He reports that the left side above his hip is hurting and it goes down his leg.  He reports that last night the pain just would not go away.  The patient took some Advil at home.  He had similar pain in the 80s and received a cortisone shot.  He was lifting a few heavy things during the week.  He reports though that he did not have any pain during lifting.  His wife states that this pain started shortly after.  The patient cannot tolerate this anymore.  He denies any weakness but has had some occasional numbness.  He rates his pain a 10 out of 10 in intensity.   Past Medical History:  Diagnosis Date  . Allergy   . Blood transfusion 1984  . Blood transfusion without reported diagnosis 1984  . Colon cancer ([place])    ascending colon  . Diabetes mellitus    niddm  . Hypertension   . Sleep apnea    stopbang score =4    Patient Active Problem List   Diagnosis Date Noted  . Cancer of ascending colon ([place]) 10/15/2011    Past Surgical History:  Procedure Laterality Date  . ANKLE FUSION  10-2010   left  . COLON SURGERY  11/08/11   lap partial colectomy  . COLONOSCOPY  2013  . colonscopy   jan 2013  . multiple orthopaedic surgeries  1984   right hand, left arm, right leg  . NOSE SURGERY  1980  . ROTATOR CUFF REPAIR  sept 2012   right  . TONSILLECTOMY AND ADENOIDECTOMY  as child    Prior to Admission medications   Medication Sig Start Date End Date Taking? Authorizing Provider  benazepril-hydrochlorthiazide (LOTENSIN HCT) 20-12.5 MG per tablet Take 1 tablet by mouth  daily.    [provider]  diazepam (VALIUM) 2 MG tablet Take 1 tablet (2 mg total) by mouth every 6 (six) hours as needed for anxiety. 12/24/17   Loney Hering, MD  lidocaine (LIDODERM) 5 % Place 1 patch onto the skin every 12 (twelve) hours. Remove & Discard patch within 12 hours or as directed by MD 12/24/17 12/24/18  Loney Hering, MD  loratadine (CLARITIN) 10 MG tablet Take 10 mg by mouth every morning.     [provider]  metFORMIN (GLUCOPHAGE) 500 MG tablet Take 1,000 mg by mouth 2 (two) times daily with a meal.     [provider]  predniSONE (STERAPRED UNI-PAK 21 TAB) 10 MG (21) TBPK tablet Take 6 tabs on day 1 Take 5 tabs on day 2 Take 4 tabs on day 3 Take 3 tabs on day 4 Take 2 tabs on day 5 Take 1 tab on day 6 12/24/17   Loney Hering, MD    Allergies Patient has no known allergies.  Family History  Problem Relation Age of Onset  . Uterine cancer Mother   . Cancer Mother   . COPD Father   . Diabetes Brother   . High blood pressure Unknown   . Colon cancer  Neg Hx     Social History Social History   Tobacco Use  . Smoking status: Never Smoker  . Smokeless tobacco: Never Used  Substance Use Topics  . Alcohol use: Yes    Alcohol/week: 0.0 oz    Comment: rarely  . Drug use: No    Review of Systems  Constitutional: No fever/chills Eyes: No visual changes. ENT: No sore throat. Cardiovascular: Denies chest pain. Respiratory: Denies shortness of breath. Gastrointestinal: No abdominal pain.  No nausea, no vomiting.  No diarrhea.  No constipation. Genitourinary: Negative for dysuria. Musculoskeletal:  back pain with pain radiating down left leg Skin: Negative for rash. Neurological: Negative for headaches, focal weakness or numbness.    ____________________________________________   PHYSICAL EXAM:  VITAL SIGNS: ED Triage Vitals  Enc Vitals Group     BP 12/24/17 0530 (!) 161/82     Pulse Rate 12/24/17 0530 70     Resp  12/24/17 0531 18     Temp 12/24/17 0531 98.9 F (37.2 C)     Temp Source 12/24/17 0531 Oral     SpO2 12/24/17 0530 97 %     Weight 12/24/17 0530 250 lb (113.4 kg)     Height 12/24/17 0530 6\' 4"  (1.93 m)     Head Circumference --      Peak Flow --      Pain Score 12/24/17 0530 10     Pain Loc --      Pain Edu? --      Excl. in [place]? --     Constitutional: Alert and oriented. Well appearing and in moderate distress. Eyes: Conjunctivae are normal. PERRL. EOMI. Head: Atraumatic. Nose: No congestion/rhinnorhea. Mouth/Throat: Mucous membranes are moist.  Oropharynx non-erythematous. Cardiovascular: Normal rate, regular rhythm. Grossly normal heart sounds.  Good peripheral circulation. Respiratory: Normal respiratory effort.  No retractions. Lungs CTAB. Gastrointestinal: Soft and nontender. No distention.  Positive bowel sounds Musculoskeletal: No lower extremity tenderness nor edema. Tenderness to palpation of left SI joint with some positive straight leg raise,  no midline lumbar spine tenderness Neurologic:  Normal speech and language.  Skin:  Skin is warm, dry and intact.  Psychiatric: Mood and affect are normal.   ____________________________________________   LABS (all labs ordered are listed, but only abnormal results are displayed)  Labs Reviewed - No data to display ____________________________________________  EKG  none ____________________________________________  RADIOLOGY  ED MD interpretation:  Lumbar spine xray: No acute osseous abnormality, moderate to severe degenerative disc disease at L5/S1, mild degenerative disc disease and facet arthropathy at L4-5 with grade 1 anterolisthesis.  Official radiology report(s): Dg Lumbar Spine 2-3 Views  Result Date: 12/24/2017 CLINICAL DATA:  Left lower back pain radiating to the left leg for the past 2 weeks after lifting a heavy object. EXAM: LUMBAR SPINE - 2-3 VIEW COMPARISON:  None. FINDINGS: Five lumbar type vertebral  bodies. No acute fracture or subluxation. Vertebral body heights are preserved. 4 mm anterolisthesis at L4-L5 due to advanced facet arthropathy. Mild disc height loss at L4-L5. Moderate to severe disc height loss at L5-S1. The sacroiliac joints are unremarkable. IMPRESSION: 1.  No acute osseous abnormality. 2. Moderate to severe degenerative disc disease at L5-S1. 3. Mild degenerative disc disease and advanced facet arthropathy at L4-L5 with grade 1 anterolisthesis. Electronically Signed   By: Titus Dubin M.D.   On: 12/24/2017 07:15    ____________________________________________   PROCEDURES  Procedure(s) performed: None  Procedures  Critical Care performed: No  ____________________________________________  INITIAL IMPRESSION / ASSESSMENT AND PLAN / ED COURSE  As part of my medical decision making, I reviewed the following data within the electronic MEDICAL RECORD NUMBER Notes from prior ED visits and [place] Controlled Substance Database   This is a 67 year old male who comes into the hospital today with some back pain.  He is been having this for a couple of weeks after doing some heavy lifting.  I am concerned about sciatica versus a fracture.    I did give the patient a shot of steroids as well as a Lidoderm patch to his left SI joint and some Valium.  He reports that his pain was initially better but it returned after he received his x-ray.  I did inform the patient of the results of the x-ray and he will be discharged to follow-up with orthopedics or his primary care physician.  I have encouraged him to rest over the weekend and follow-up with his doctor.  He will be discharged home.      ____________________________________________   FINAL CLINICAL IMPRESSION(S) / ED DIAGNOSES  Final diagnoses:  Acute left-sided low back pain with left-sided sciatica     ED Discharge Orders        Ordered    predniSONE (STERAPRED UNI-PAK 21 TAB) 10 MG (21) TBPK tablet     12/24/17 0747      diazepam (VALIUM) 2 MG tablet  Every 6 hours PRN     12/24/17 0747    lidocaine (LIDODERM) 5 %  Every 12 hours     12/24/17 0747       Note:  This document was prepared using Dragon voice recognition software and may include unintentional dictation errors.    Loney Hering, MD 12/24/17 [phone]

## 2018-01-02 DIAGNOSIS — M9905 Segmental and somatic dysfunction of pelvic region: Secondary | ICD-10-CM | POA: Diagnosis not present

## 2018-01-02 DIAGNOSIS — M5432 Sciatica, left side: Secondary | ICD-10-CM | POA: Diagnosis not present

## 2018-01-02 DIAGNOSIS — M5124 Other intervertebral disc displacement, thoracic region: Secondary | ICD-10-CM | POA: Diagnosis not present

## 2018-01-02 DIAGNOSIS — M5126 Other intervertebral disc displacement, lumbar region: Secondary | ICD-10-CM | POA: Diagnosis not present

## 2018-01-02 DIAGNOSIS — M9902 Segmental and somatic dysfunction of thoracic region: Secondary | ICD-10-CM | POA: Diagnosis not present

## 2018-01-02 DIAGNOSIS — M9903 Segmental and somatic dysfunction of lumbar region: Secondary | ICD-10-CM | POA: Diagnosis not present

## 2018-01-04 DIAGNOSIS — M9903 Segmental and somatic dysfunction of lumbar region: Secondary | ICD-10-CM | POA: Diagnosis not present

## 2018-01-04 DIAGNOSIS — M9905 Segmental and somatic dysfunction of pelvic region: Secondary | ICD-10-CM | POA: Diagnosis not present

## 2018-01-04 DIAGNOSIS — M5126 Other intervertebral disc displacement, lumbar region: Secondary | ICD-10-CM | POA: Diagnosis not present

## 2018-01-04 DIAGNOSIS — M5124 Other intervertebral disc displacement, thoracic region: Secondary | ICD-10-CM | POA: Diagnosis not present

## 2018-01-04 DIAGNOSIS — M5432 Sciatica, left side: Secondary | ICD-10-CM | POA: Diagnosis not present

## 2018-01-04 DIAGNOSIS — M9902 Segmental and somatic dysfunction of thoracic region: Secondary | ICD-10-CM | POA: Diagnosis not present

## 2018-01-09 ENCOUNTER — Encounter: Payer: Self-pay | Admitting: Internal Medicine

## 2018-01-13 ENCOUNTER — Other Ambulatory Visit: Payer: Self-pay | Admitting: Physical Medicine and Rehabilitation

## 2018-01-13 DIAGNOSIS — M5416 Radiculopathy, lumbar region: Secondary | ICD-10-CM

## 2018-01-13 DIAGNOSIS — M5136 Other intervertebral disc degeneration, lumbar region: Secondary | ICD-10-CM | POA: Diagnosis not present

## 2018-01-24 ENCOUNTER — Ambulatory Visit
Admission: RE | Admit: 2018-01-24 | Discharge: 2018-01-24 | Disposition: A | Discharge status: Home / Self Care | Payer: Medicare HMO | Source: Ambulatory Visit | Attending: Physical Medicine and Rehabilitation | Admitting: Physical Medicine and Rehabilitation

## 2018-01-24 DIAGNOSIS — M48061 Spinal stenosis, lumbar region without neurogenic claudication: Secondary | ICD-10-CM | POA: Diagnosis not present

## 2018-01-24 DIAGNOSIS — M2578 Osteophyte, vertebrae: Secondary | ICD-10-CM | POA: Diagnosis not present

## 2018-01-24 DIAGNOSIS — M5416 Radiculopathy, lumbar region: Secondary | ICD-10-CM | POA: Diagnosis not present

## 2018-01-24 DIAGNOSIS — M545 Low back pain: Secondary | ICD-10-CM | POA: Diagnosis not present

## 2018-01-26 DIAGNOSIS — M5416 Radiculopathy, lumbar region: Secondary | ICD-10-CM | POA: Diagnosis not present

## 2018-01-26 DIAGNOSIS — M5136 Other intervertebral disc degeneration, lumbar region: Secondary | ICD-10-CM | POA: Diagnosis not present

## 2018-01-27 ENCOUNTER — Ambulatory Visit: Payer: Medicare HMO

## 2018-03-01 DIAGNOSIS — M5136 Other intervertebral disc degeneration, lumbar region: Secondary | ICD-10-CM | POA: Diagnosis not present

## 2018-03-01 DIAGNOSIS — M5416 Radiculopathy, lumbar region: Secondary | ICD-10-CM | POA: Diagnosis not present

## 2018-03-22 DIAGNOSIS — M5416 Radiculopathy, lumbar region: Secondary | ICD-10-CM | POA: Diagnosis not present

## 2018-03-22 DIAGNOSIS — M5126 Other intervertebral disc displacement, lumbar region: Secondary | ICD-10-CM | POA: Diagnosis not present

## 2018-03-22 DIAGNOSIS — M4726 Other spondylosis with radiculopathy, lumbar region: Secondary | ICD-10-CM | POA: Diagnosis not present

## 2018-03-22 DIAGNOSIS — M5136 Other intervertebral disc degeneration, lumbar region: Secondary | ICD-10-CM | POA: Diagnosis not present

## 2018-03-29 DIAGNOSIS — M5136 Other intervertebral disc degeneration, lumbar region: Secondary | ICD-10-CM | POA: Diagnosis not present

## 2018-03-29 DIAGNOSIS — M47816 Spondylosis without myelopathy or radiculopathy, lumbar region: Secondary | ICD-10-CM | POA: Diagnosis not present

## 2018-03-29 DIAGNOSIS — M5416 Radiculopathy, lumbar region: Secondary | ICD-10-CM | POA: Diagnosis not present

## 2018-04-18 DIAGNOSIS — M5136 Other intervertebral disc degeneration, lumbar region: Secondary | ICD-10-CM | POA: Diagnosis not present

## 2018-04-18 DIAGNOSIS — M47816 Spondylosis without myelopathy or radiculopathy, lumbar region: Secondary | ICD-10-CM | POA: Diagnosis not present

## 2018-04-18 DIAGNOSIS — M545 Low back pain: Secondary | ICD-10-CM | POA: Diagnosis not present

## 2018-04-18 DIAGNOSIS — M5440 Lumbago with sciatica, unspecified side: Secondary | ICD-10-CM | POA: Diagnosis not present

## 2018-04-18 DIAGNOSIS — M5416 Radiculopathy, lumbar region: Secondary | ICD-10-CM | POA: Diagnosis not present

## 2018-04-18 DIAGNOSIS — M5126 Other intervertebral disc displacement, lumbar region: Secondary | ICD-10-CM | POA: Diagnosis not present

## 2018-05-18 DIAGNOSIS — M1990 Unspecified osteoarthritis, unspecified site: Secondary | ICD-10-CM | POA: Diagnosis not present

## 2018-05-18 DIAGNOSIS — Z23 Encounter for immunization: Secondary | ICD-10-CM | POA: Diagnosis not present

## 2018-05-18 DIAGNOSIS — Z6833 Body mass index (BMI) 33.0-33.9, adult: Secondary | ICD-10-CM | POA: Diagnosis not present

## 2018-05-18 DIAGNOSIS — D692 Other nonthrombocytopenic purpura: Secondary | ICD-10-CM | POA: Diagnosis not present

## 2018-05-18 DIAGNOSIS — E119 Type 2 diabetes mellitus without complications: Secondary | ICD-10-CM | POA: Diagnosis not present

## 2018-05-18 DIAGNOSIS — E7800 Pure hypercholesterolemia, unspecified: Secondary | ICD-10-CM | POA: Diagnosis not present

## 2018-05-18 DIAGNOSIS — E668 Other obesity: Secondary | ICD-10-CM | POA: Diagnosis not present

## 2018-05-18 DIAGNOSIS — I10 Essential (primary) hypertension: Secondary | ICD-10-CM | POA: Diagnosis not present

## 2018-06-06 DIAGNOSIS — M47816 Spondylosis without myelopathy or radiculopathy, lumbar region: Secondary | ICD-10-CM | POA: Diagnosis not present

## 2018-06-06 DIAGNOSIS — M4726 Other spondylosis with radiculopathy, lumbar region: Secondary | ICD-10-CM | POA: Diagnosis not present

## 2018-08-07 DIAGNOSIS — M5416 Radiculopathy, lumbar region: Secondary | ICD-10-CM | POA: Diagnosis not present

## 2018-08-07 DIAGNOSIS — M5136 Other intervertebral disc degeneration, lumbar region: Secondary | ICD-10-CM | POA: Diagnosis not present

## 2018-08-07 DIAGNOSIS — M5126 Other intervertebral disc displacement, lumbar region: Secondary | ICD-10-CM | POA: Diagnosis not present

## 2018-09-27 DIAGNOSIS — Z85038 Personal history of other malignant neoplasm of large intestine: Secondary | ICD-10-CM | POA: Diagnosis not present

## 2018-10-19 DIAGNOSIS — D123 Benign neoplasm of transverse colon: Secondary | ICD-10-CM | POA: Diagnosis not present

## 2018-10-19 DIAGNOSIS — K635 Polyp of colon: Secondary | ICD-10-CM | POA: Diagnosis not present

## 2018-10-19 DIAGNOSIS — K640 First degree hemorrhoids: Secondary | ICD-10-CM | POA: Diagnosis not present

## 2018-10-19 DIAGNOSIS — Z85038 Personal history of other malignant neoplasm of large intestine: Secondary | ICD-10-CM | POA: Diagnosis not present

## 2018-11-01 DIAGNOSIS — R82998 Other abnormal findings in urine: Secondary | ICD-10-CM | POA: Diagnosis not present

## 2018-11-01 DIAGNOSIS — E119 Type 2 diabetes mellitus without complications: Secondary | ICD-10-CM | POA: Diagnosis not present

## 2018-11-01 DIAGNOSIS — I10 Essential (primary) hypertension: Secondary | ICD-10-CM | POA: Diagnosis not present

## 2018-11-01 DIAGNOSIS — E7800 Pure hypercholesterolemia, unspecified: Secondary | ICD-10-CM | POA: Diagnosis not present

## 2018-11-01 DIAGNOSIS — Z125 Encounter for screening for malignant neoplasm of prostate: Secondary | ICD-10-CM | POA: Diagnosis not present

## 2018-11-06 DIAGNOSIS — D692 Other nonthrombocytopenic purpura: Secondary | ICD-10-CM | POA: Diagnosis not present

## 2018-11-06 DIAGNOSIS — E119 Type 2 diabetes mellitus without complications: Secondary | ICD-10-CM | POA: Diagnosis not present

## 2018-11-06 DIAGNOSIS — Z85038 Personal history of other malignant neoplasm of large intestine: Secondary | ICD-10-CM | POA: Diagnosis not present

## 2018-11-06 DIAGNOSIS — D126 Benign neoplasm of colon, unspecified: Secondary | ICD-10-CM | POA: Diagnosis not present

## 2018-11-06 DIAGNOSIS — N528 Other male erectile dysfunction: Secondary | ICD-10-CM | POA: Diagnosis not present

## 2018-11-06 DIAGNOSIS — I10 Essential (primary) hypertension: Secondary | ICD-10-CM | POA: Diagnosis not present

## 2018-11-06 DIAGNOSIS — M1990 Unspecified osteoarthritis, unspecified site: Secondary | ICD-10-CM | POA: Diagnosis not present

## 2018-11-06 DIAGNOSIS — Z0000 Encounter for general adult medical examination without abnormal findings: Secondary | ICD-10-CM | POA: Diagnosis not present

## 2018-11-06 DIAGNOSIS — E668 Other obesity: Secondary | ICD-10-CM | POA: Diagnosis not present

## 2018-11-06 DIAGNOSIS — E7800 Pure hypercholesterolemia, unspecified: Secondary | ICD-10-CM | POA: Diagnosis not present

## 2018-11-07 DIAGNOSIS — Z1212 Encounter for screening for malignant neoplasm of rectum: Secondary | ICD-10-CM | POA: Diagnosis not present

## 2019-05-09 DIAGNOSIS — E669 Obesity, unspecified: Secondary | ICD-10-CM | POA: Diagnosis not present

## 2019-05-09 DIAGNOSIS — E119 Type 2 diabetes mellitus without complications: Secondary | ICD-10-CM | POA: Diagnosis not present

## 2019-05-09 DIAGNOSIS — D692 Other nonthrombocytopenic purpura: Secondary | ICD-10-CM | POA: Diagnosis not present

## 2019-05-09 DIAGNOSIS — E7800 Pure hypercholesterolemia, unspecified: Secondary | ICD-10-CM | POA: Diagnosis not present

## 2019-05-09 DIAGNOSIS — M1990 Unspecified osteoarthritis, unspecified site: Secondary | ICD-10-CM | POA: Diagnosis not present

## 2019-05-09 DIAGNOSIS — I10 Essential (primary) hypertension: Secondary | ICD-10-CM | POA: Diagnosis not present

## 2019-06-08 DIAGNOSIS — Z23 Encounter for immunization: Secondary | ICD-10-CM | POA: Diagnosis not present

## 2019-07-07 IMAGING — CR DG LUMBAR SPINE 2-3V
1 series · 3 of 3 positions shown · non-contrast
Comparison: None.

CLINICAL DATA: Left lower back pain radiating to the left leg for
the past 2 weeks after lifting a heavy object.

EXAM:
LUMBAR SPINE - 2-3 VIEW

[Series 1: dg lumbar spine 2-3 views · 0.14mm/px · 3 of 3 slices shown]
[im 1/3]
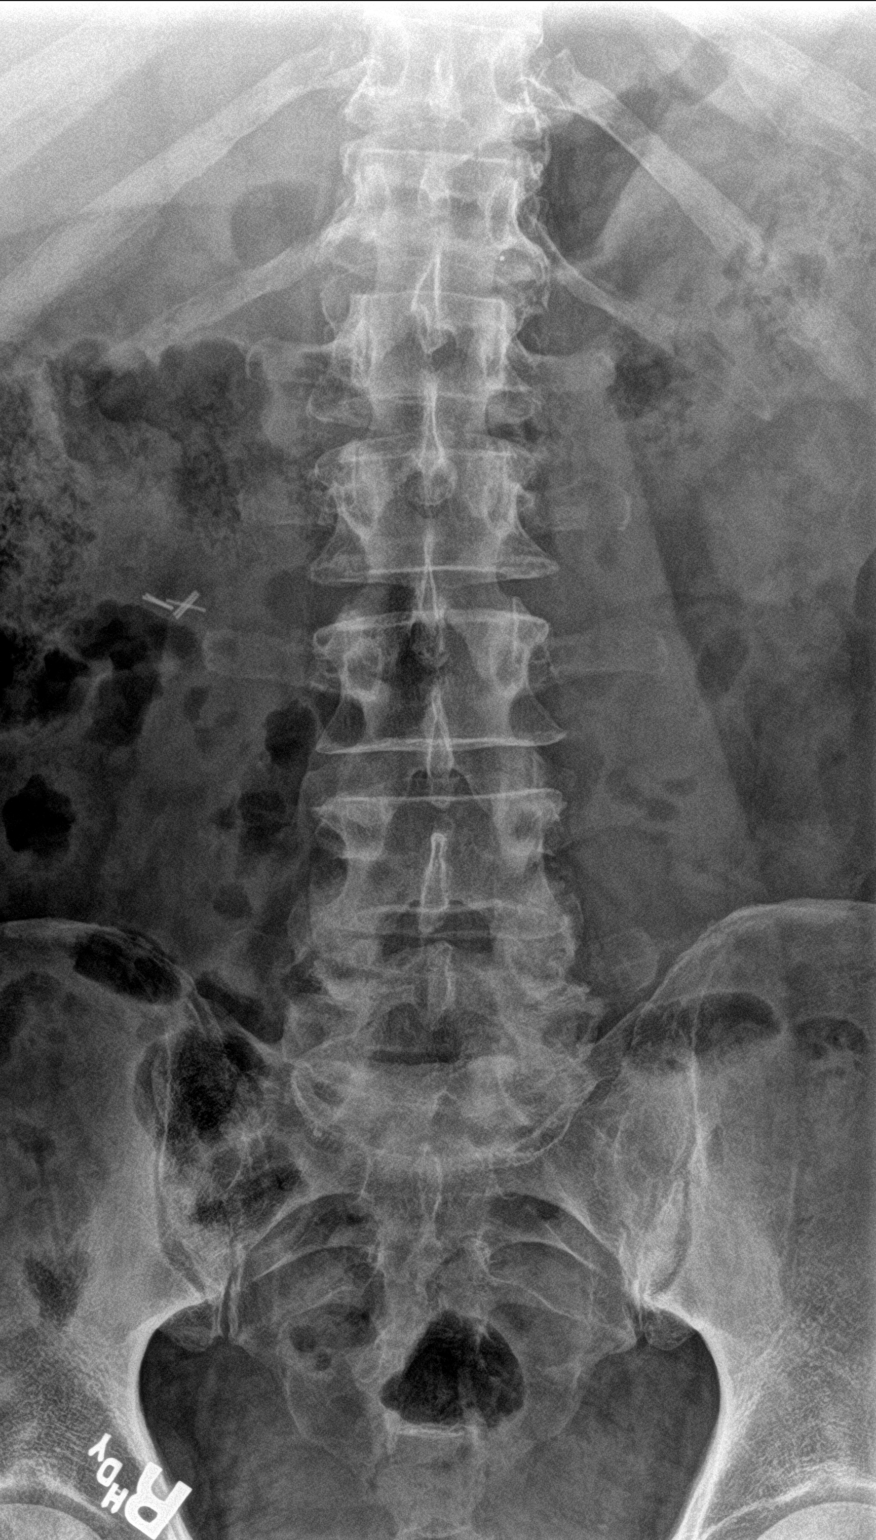
[im 2/3]
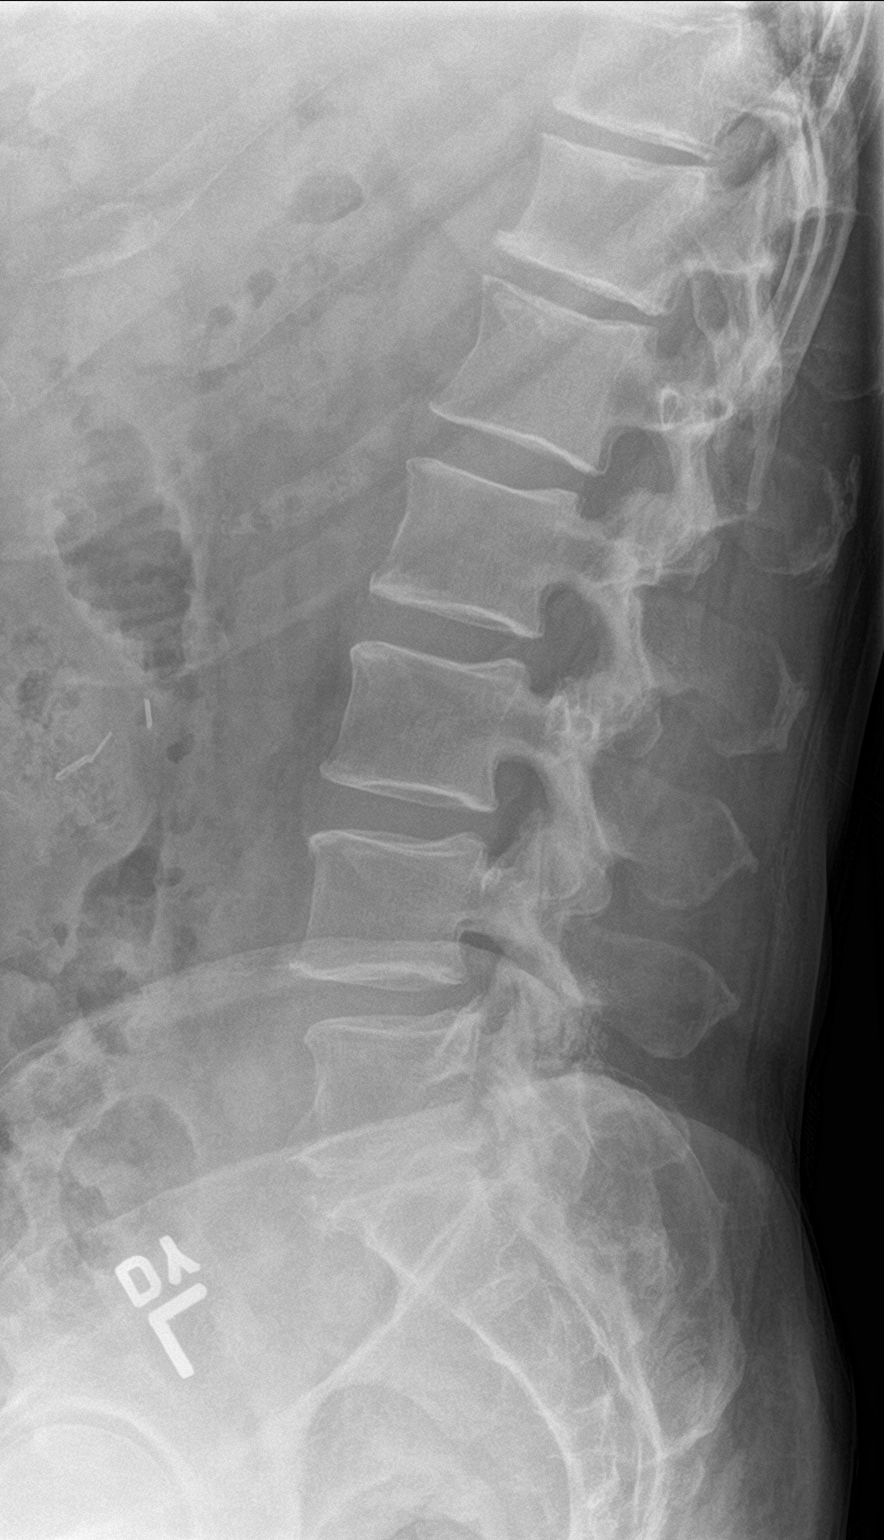
[im 3/3]
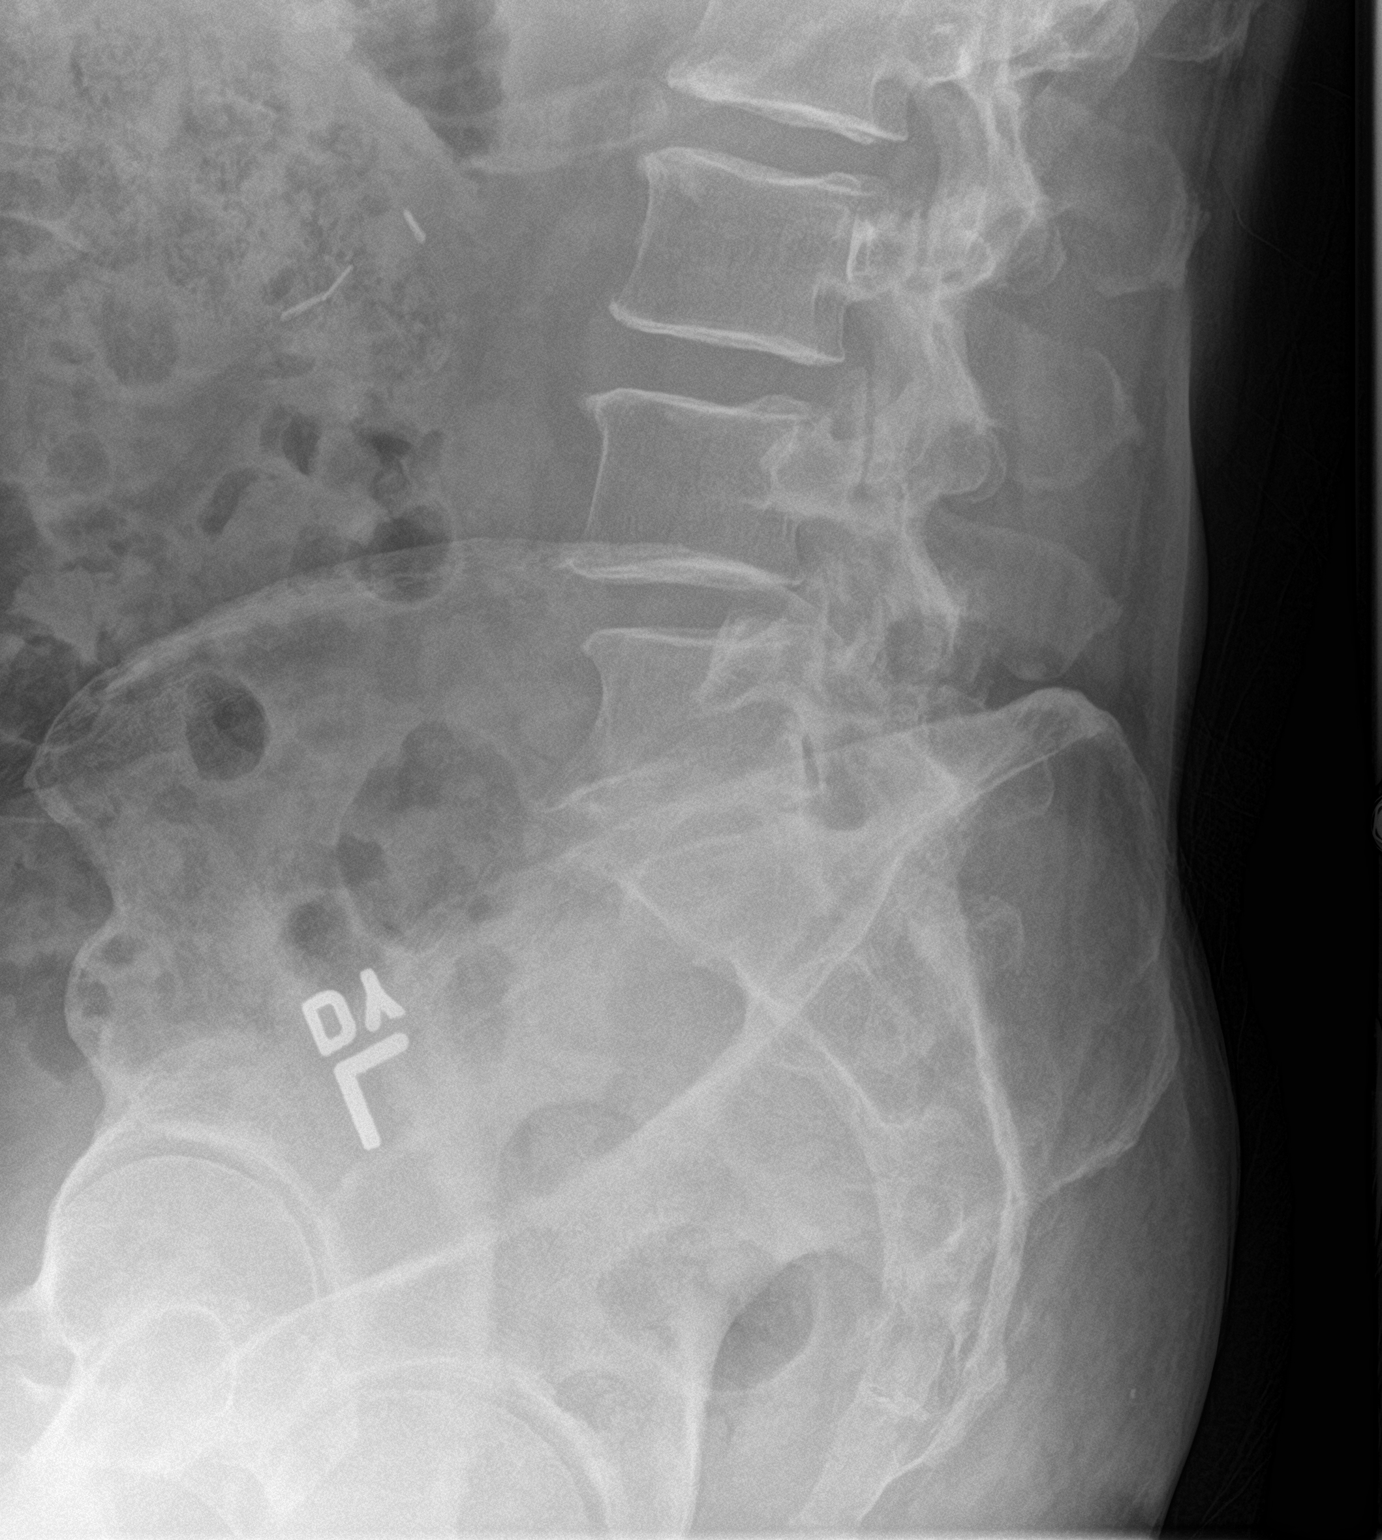

[3 of 3 positions shown; findings below may reference images not displayed]

FINDINGS: Five lumbar type vertebral bodies. No acute fracture or subluxation.
Vertebral body heights are preserved. 4 mm anterolisthesis at L4-L5
due to advanced facet arthropathy. Mild disc height loss at L4-L5.
Moderate to severe disc height loss at L5-S1. The sacroiliac joints
are unremarkable.
IMPRESSION: 1.  No acute osseous abnormality.
2. Moderate to severe degenerative disc disease at L5-S1.
3. Mild degenerative disc disease and advanced facet arthropathy at
L4-L5 with grade 1 anterolisthesis.

## 2019-11-07 DIAGNOSIS — Z125 Encounter for screening for malignant neoplasm of prostate: Secondary | ICD-10-CM | POA: Diagnosis not present

## 2019-11-07 DIAGNOSIS — E119 Type 2 diabetes mellitus without complications: Secondary | ICD-10-CM | POA: Diagnosis not present

## 2019-11-07 DIAGNOSIS — E7800 Pure hypercholesterolemia, unspecified: Secondary | ICD-10-CM | POA: Diagnosis not present

## 2019-11-07 DIAGNOSIS — Z0000 Encounter for general adult medical examination without abnormal findings: Secondary | ICD-10-CM | POA: Diagnosis not present

## 2019-11-14 DIAGNOSIS — D692 Other nonthrombocytopenic purpura: Secondary | ICD-10-CM | POA: Diagnosis not present

## 2019-11-14 DIAGNOSIS — E119 Type 2 diabetes mellitus without complications: Secondary | ICD-10-CM | POA: Diagnosis not present

## 2019-11-14 DIAGNOSIS — I10 Essential (primary) hypertension: Secondary | ICD-10-CM | POA: Diagnosis not present

## 2019-11-14 DIAGNOSIS — N529 Male erectile dysfunction, unspecified: Secondary | ICD-10-CM | POA: Diagnosis not present

## 2019-11-14 DIAGNOSIS — M1990 Unspecified osteoarthritis, unspecified site: Secondary | ICD-10-CM | POA: Diagnosis not present

## 2019-11-14 DIAGNOSIS — Z85038 Personal history of other malignant neoplasm of large intestine: Secondary | ICD-10-CM | POA: Diagnosis not present

## 2019-11-14 DIAGNOSIS — Z0000 Encounter for general adult medical examination without abnormal findings: Secondary | ICD-10-CM | POA: Diagnosis not present

## 2019-11-14 DIAGNOSIS — R82998 Other abnormal findings in urine: Secondary | ICD-10-CM | POA: Diagnosis not present

## 2019-11-14 DIAGNOSIS — D126 Benign neoplasm of colon, unspecified: Secondary | ICD-10-CM | POA: Diagnosis not present

## 2019-11-14 DIAGNOSIS — E669 Obesity, unspecified: Secondary | ICD-10-CM | POA: Diagnosis not present

## 2019-11-14 DIAGNOSIS — R7989 Other specified abnormal findings of blood chemistry: Secondary | ICD-10-CM | POA: Diagnosis not present

## 2019-11-15 DIAGNOSIS — R7989 Other specified abnormal findings of blood chemistry: Secondary | ICD-10-CM | POA: Diagnosis not present

## 2019-11-15 DIAGNOSIS — Z1331 Encounter for screening for depression: Secondary | ICD-10-CM | POA: Diagnosis not present

## 2019-11-15 DIAGNOSIS — Z1339 Encounter for screening examination for other mental health and behavioral disorders: Secondary | ICD-10-CM | POA: Diagnosis not present

## 2019-11-16 DIAGNOSIS — Z1212 Encounter for screening for malignant neoplasm of rectum: Secondary | ICD-10-CM | POA: Diagnosis not present

## 2019-11-27 ENCOUNTER — Telehealth: Payer: Self-pay | Admitting: Internal Medicine

## 2019-11-27 ENCOUNTER — Encounter: Payer: Self-pay | Admitting: Internal Medicine

## 2019-11-27 NOTE — Telephone Encounter (Signed)
Ov scheduled on 5/11 at 11:00am. Pt will bring records from Dr. Tiffany Kocher.

## 2019-11-27 NOTE — Telephone Encounter (Signed)
Hi Dr. Henrene Pastor, we received a referral from Dr. Osborne Casco for elevated LFTs. Patient used to see you but transfered care to Dr. Tiffany Kocher. Pt's wife stated that the only reason why they switched was because Dr. Tiffany Kocher was closer to their house but he recently retired. Would you take patient back? Thank you.

## 2019-11-27 NOTE — Telephone Encounter (Signed)
Routine office follow-up would be fine.  He needs to bring the relevant medical records with him.

## 2020-01-01 ENCOUNTER — Ambulatory Visit: Payer: Medicare HMO | Admitting: Internal Medicine

## 2020-01-01 ENCOUNTER — Other Ambulatory Visit (INDEPENDENT_AMBULATORY_CARE_PROVIDER_SITE_OTHER): Payer: Medicare HMO

## 2020-01-01 ENCOUNTER — Encounter: Payer: Self-pay | Admitting: Internal Medicine

## 2020-01-01 VITALS — BP 120/70 | HR 69 | Temp 98.5°F | Ht 76.0 in | Wt 248.0 lb

## 2020-01-01 DIAGNOSIS — R7989 Other specified abnormal findings of blood chemistry: Secondary | ICD-10-CM

## 2020-01-01 DIAGNOSIS — Z85038 Personal history of other malignant neoplasm of large intestine: Secondary | ICD-10-CM | POA: Diagnosis not present

## 2020-01-01 LAB — CBC WITH DIFFERENTIAL/PLATELET
Basophils Absolute: 0 10*3/uL (ref 0.0–0.1)
Basophils Relative: 1 % (ref 0.0–3.0)
Eosinophils Absolute: 0.1 10*3/uL (ref 0.0–0.7)
Eosinophils Relative: 1.7 % (ref 0.0–5.0)
HCT: 45.3 % (ref 39.0–52.0)
Hemoglobin: 15.9 g/dL (ref 13.0–17.0)
Lymphocytes Relative: 24.6 % (ref 12.0–46.0)
Lymphs Abs: 1.2 10*3/uL (ref 0.7–4.0)
MCHC: 35.1 g/dL (ref 30.0–36.0)
MCV: 93 fl (ref 78.0–100.0)
Monocytes Absolute: 0.5 10*3/uL (ref 0.1–1.0)
Monocytes Relative: 9.8 % (ref 3.0–12.0)
Neutro Abs: 3 10*3/uL (ref 1.4–7.7)
Neutrophils Relative %: 62.9 % (ref 43.0–77.0)
Platelets: 156 10*3/uL (ref 150.0–400.0)
RBC: 4.87 Mil/uL (ref 4.22–5.81)
RDW: 13 % (ref 11.5–15.5)
WBC: 4.7 10*3/uL (ref 4.0–10.5)

## 2020-01-01 LAB — BASIC METABOLIC PANEL
BUN: 15 mg/dL (ref 6–23)
CO2: 26 mEq/L (ref 19–32)
Calcium: 9.4 mg/dL (ref 8.4–10.5)
Chloride: 104 mEq/L (ref 96–112)
Creatinine, Ser: 0.88 mg/dL (ref 0.40–1.50)
GFR: 86.26 mL/min (ref >60.00)
Glucose, Bld: 113 mg/dL — ABNORMAL HIGH (ref 70–99)
Potassium: 4.3 mEq/L (ref 3.5–5.1)
Sodium: 136 mEq/L (ref 135–145)

## 2020-01-01 LAB — TSH: TSH: 1.75 u[IU]/mL (ref 0.35–4.50)

## 2020-01-01 LAB — PROTIME-INR
INR: 1 ratio (ref 0.8–1.0)
Prothrombin Time: 11.6 s (ref 9.6–13.1)

## 2020-01-01 LAB — HEPATIC FUNCTION PANEL
ALT: 23 U/L (ref 0–53)
AST: 14 U/L (ref 0–37)
Albumin: 4.5 g/dL (ref 3.5–5.2)
Alkaline Phosphatase: 61 U/L (ref 39–117)
Bilirubin, Direct: 0.1 mg/dL (ref 0.0–0.3)
Total Bilirubin: 0.7 mg/dL (ref 0.2–1.2)
Total Protein: 7 g/dL (ref 6.0–8.3)

## 2020-01-01 LAB — IRON: Iron: 109 ug/dL (ref 42–165)

## 2020-01-01 NOTE — Progress Notes (Signed)
HISTORY OF PRESENT ILLNESS:  Daniel Greer is a 69 y.o. 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 Vira Greer).  He is sent here today by his primary care provider regarding newly recognized elevated liver test.  Blood work from November 14, 2019 revealed AST 70, ALT 178, alkaline phosphatase 80, total bilirubin 0.7.  Total protein 6.7 and albumin 4.2.  Subsequent testing including ANA, creatinine kinase, ceruloplasmin, ferritin, hepatitis B, smooth muscle antibody, liver kidney microsomal antibody, and hepatitis C were all negative or normal.  There was evidence for prior hepatitis A infection, with immunity.  The patient denies a family history of liver disease.  He denies personal history of liver disease.  He does not use alcohol.  He has lost about 20 pounds in the past several years.  His last hemoglobin A1c was 6.2.  He did receive transfusions in the 1980s.  He has not been vaccinated against Covid.  No relevant imaging  REVIEW OF SYSTEMS:  All non-GI ROS negative unless otherwise stated in the HPI except for excessive urination  Past Medical History:  Diagnosis Date  . Allergy   . Blood transfusion 1984  . Blood transfusion without reported diagnosis 1984  . 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 Diabetes mellitus    niddm  . Hypertension   . Obesity   . Sleep apnea    stopbang score =4    Past Surgical History:  Procedure Laterality Date  . ANKLE FUSION  10-2010   left  . COLON SURGERY  11/08/11   lap partial colectomy  . COLONOSCOPY  2013  . colonscopy   jan 2013  . multiple orthopaedic surgeries  1984   right hand, left arm, right leg  . NOSE SURGERY  1980  . ROTATOR CUFF REPAIR  sept 2012   right  . TONSILLECTOMY AND ADENOIDECTOMY  as child    Social History Daniel Greer  reports that he has never smoked. He has never used smokeless tobacco.  He reports previous alcohol use. He reports that he does not use drugs.  family history includes COPD in his father; Diabetes in his brother; Hypertension in his maternal uncle and mother; Uterine cancer in his mother.  No Known Allergies     PHYSICAL EXAMINATION: Vital signs: BP 120/70   Pulse 69   Temp 98.5 F (36.9 C)   Ht 6\' 4"  (1.93 m)   Wt 248 lb (112.5 kg)   BMI 30.19 kg/m   Constitutional: generally well-appearing, no acute distress Psychiatric: alert and oriented x3, cooperative Eyes: extraocular movements intact, anicteric, conjunctiva pink Mouth: oral pharynx moist, no lesions Neck: supple no lymphadenopathy Cardiovascular: heart regular rate and rhythm, no murmur Lungs: clear to auscultation bilaterally Abdomen: soft, nontender, nondistended, no obvious ascites, no peritoneal signs, normal bowel sounds, no organomegaly Rectal: Omitted Extremities: no clubbing or cyanosis.  Trace lower extremity edema bilaterally Skin: no lesions on visible extremities Neuro: No focal deficits. No asterixis.    ASSESSMENT:  1.  Elevated transaminases.  Likely fatty liver/NASH. 2.  Personal history of malignant colon polyp status post hemicolectomy 2013.  Surveillance examinations 2016 and 2020 3.  General medical problems.  BMI 30  PLAN:  1.  Repeat liver test today, PT/INR, tissue transglutaminase antibody 2.  Schedule abdominal ultrasound with attention to the liver. 3.  Office follow-up 1 month review the above. A total time  of 45 minutes was spent preparing to see the patient, reviewing outside laboratory test, reviewing outside history and separately obtaining the pertinent history.  Performing comprehensive physical examination, counseling the patient and his wife regarding his above listed issues.  Also, ordering advanced blood work and advanced imaging studies.  Finally, documenting clinical information in the health record

## 2020-01-01 NOTE — Patient Instructions (Signed)
Your provider has requested that you go to the basement level for lab work before leaving today. Press "B" on the elevator. The lab is located at the first door on the left as you exit the elevator.  You have been scheduled for an abdominal ultrasound at [facility] (1st floor of hospital) on 01/04/2020 at 9:30am. Please arrive 15 minutes prior to your appointment for registration. Make certain not to have anything to eat or drink after midnight prior to your appointment. Should you need to reschedule your appointment, please contact radiology at [phone]. This test typically takes about 30 minutes to perform.

## 2020-01-02 LAB — TISSUE TRANSGLUTAMINASE, IGA: (tTG) Ab, IgA: 1 U/mL

## 2020-01-04 ENCOUNTER — Ambulatory Visit (HOSPITAL_COMMUNITY)
Admission: RE | Admit: 2020-01-04 | Discharge: 2020-01-04 | Disposition: A | Discharge status: Home / Self Care | Payer: Medicare HMO | Source: Ambulatory Visit | Attending: Internal Medicine | Admitting: Internal Medicine

## 2020-01-04 ENCOUNTER — Other Ambulatory Visit: Payer: Self-pay | Admitting: Internal Medicine

## 2020-01-04 ENCOUNTER — Other Ambulatory Visit: Payer: Self-pay

## 2020-01-04 DIAGNOSIS — R748 Abnormal levels of other serum enzymes: Secondary | ICD-10-CM | POA: Diagnosis not present

## 2020-01-04 DIAGNOSIS — R7989 Other specified abnormal findings of blood chemistry: Secondary | ICD-10-CM

## 2020-04-15 ENCOUNTER — Ambulatory Visit: Payer: Medicare HMO | Admitting: Internal Medicine

## 2020-06-02 DIAGNOSIS — Z6830 Body mass index (BMI) 30.0-30.9, adult: Secondary | ICD-10-CM | POA: Diagnosis not present

## 2020-06-02 DIAGNOSIS — E669 Obesity, unspecified: Secondary | ICD-10-CM | POA: Diagnosis not present

## 2020-06-02 DIAGNOSIS — Z23 Encounter for immunization: Secondary | ICD-10-CM | POA: Diagnosis not present

## 2020-06-02 DIAGNOSIS — N529 Male erectile dysfunction, unspecified: Secondary | ICD-10-CM | POA: Diagnosis not present

## 2020-06-02 DIAGNOSIS — E7800 Pure hypercholesterolemia, unspecified: Secondary | ICD-10-CM | POA: Diagnosis not present

## 2020-06-02 DIAGNOSIS — N401 Enlarged prostate with lower urinary tract symptoms: Secondary | ICD-10-CM | POA: Diagnosis not present

## 2020-06-02 DIAGNOSIS — M1990 Unspecified osteoarthritis, unspecified site: Secondary | ICD-10-CM | POA: Diagnosis not present

## 2020-06-02 DIAGNOSIS — E119 Type 2 diabetes mellitus without complications: Secondary | ICD-10-CM | POA: Diagnosis not present

## 2020-06-02 DIAGNOSIS — I10 Essential (primary) hypertension: Secondary | ICD-10-CM | POA: Diagnosis not present

## 2020-06-02 DIAGNOSIS — D692 Other nonthrombocytopenic purpura: Secondary | ICD-10-CM | POA: Diagnosis not present

## 2020-07-09 DIAGNOSIS — Z20822 Contact with and (suspected) exposure to covid-19: Secondary | ICD-10-CM | POA: Diagnosis not present

## 2020-09-24 DIAGNOSIS — Z03818 Encounter for observation for suspected exposure to other biological agents ruled out: Secondary | ICD-10-CM | POA: Diagnosis not present

## 2020-09-24 DIAGNOSIS — Z20822 Contact with and (suspected) exposure to covid-19: Secondary | ICD-10-CM | POA: Diagnosis not present

## 2020-10-01 DIAGNOSIS — R059 Cough, unspecified: Secondary | ICD-10-CM | POA: Diagnosis not present

## 2020-10-01 DIAGNOSIS — Z8616 Personal history of COVID-19: Secondary | ICD-10-CM | POA: Diagnosis not present

## 2020-10-20 DIAGNOSIS — H43813 Vitreous degeneration, bilateral: Secondary | ICD-10-CM | POA: Diagnosis not present

## 2020-11-11 DIAGNOSIS — E119 Type 2 diabetes mellitus without complications: Secondary | ICD-10-CM | POA: Diagnosis not present

## 2020-11-11 DIAGNOSIS — Z125 Encounter for screening for malignant neoplasm of prostate: Secondary | ICD-10-CM | POA: Diagnosis not present

## 2020-11-11 DIAGNOSIS — E7800 Pure hypercholesterolemia, unspecified: Secondary | ICD-10-CM | POA: Diagnosis not present

## 2020-11-18 DIAGNOSIS — Z1339 Encounter for screening examination for other mental health and behavioral disorders: Secondary | ICD-10-CM | POA: Diagnosis not present

## 2020-11-18 DIAGNOSIS — N529 Male erectile dysfunction, unspecified: Secondary | ICD-10-CM | POA: Diagnosis not present

## 2020-11-18 DIAGNOSIS — Z1331 Encounter for screening for depression: Secondary | ICD-10-CM | POA: Diagnosis not present

## 2020-11-18 DIAGNOSIS — Z6830 Body mass index (BMI) 30.0-30.9, adult: Secondary | ICD-10-CM | POA: Diagnosis not present

## 2020-11-18 DIAGNOSIS — E7800 Pure hypercholesterolemia, unspecified: Secondary | ICD-10-CM | POA: Diagnosis not present

## 2020-11-18 DIAGNOSIS — Z0000 Encounter for general adult medical examination without abnormal findings: Secondary | ICD-10-CM | POA: Diagnosis not present

## 2020-11-18 DIAGNOSIS — D692 Other nonthrombocytopenic purpura: Secondary | ICD-10-CM | POA: Diagnosis not present

## 2020-11-18 DIAGNOSIS — R82998 Other abnormal findings in urine: Secondary | ICD-10-CM | POA: Diagnosis not present

## 2020-11-18 DIAGNOSIS — I10 Essential (primary) hypertension: Secondary | ICD-10-CM | POA: Diagnosis not present

## 2020-11-18 DIAGNOSIS — E119 Type 2 diabetes mellitus without complications: Secondary | ICD-10-CM | POA: Diagnosis not present

## 2020-11-18 DIAGNOSIS — E669 Obesity, unspecified: Secondary | ICD-10-CM | POA: Diagnosis not present

## 2020-11-18 DIAGNOSIS — Z8616 Personal history of COVID-19: Secondary | ICD-10-CM | POA: Diagnosis not present

## 2020-11-18 DIAGNOSIS — M1990 Unspecified osteoarthritis, unspecified site: Secondary | ICD-10-CM | POA: Diagnosis not present

## 2020-11-20 DIAGNOSIS — Z1212 Encounter for screening for malignant neoplasm of rectum: Secondary | ICD-10-CM | POA: Diagnosis not present

## 2020-11-24 DIAGNOSIS — H52203 Unspecified astigmatism, bilateral: Secondary | ICD-10-CM | POA: Diagnosis not present

## 2020-11-24 DIAGNOSIS — H524 Presbyopia: Secondary | ICD-10-CM | POA: Diagnosis not present

## 2020-11-24 DIAGNOSIS — E119 Type 2 diabetes mellitus without complications: Secondary | ICD-10-CM | POA: Diagnosis not present

## 2020-11-24 DIAGNOSIS — H43812 Vitreous degeneration, left eye: Secondary | ICD-10-CM | POA: Diagnosis not present

## 2021-07-01 DIAGNOSIS — Z03818 Encounter for observation for suspected exposure to other biological agents ruled out: Secondary | ICD-10-CM | POA: Diagnosis not present

## 2021-07-01 DIAGNOSIS — Z20822 Contact with and (suspected) exposure to covid-19: Secondary | ICD-10-CM | POA: Diagnosis not present

## 2021-07-03 DIAGNOSIS — D692 Other nonthrombocytopenic purpura: Secondary | ICD-10-CM | POA: Diagnosis not present

## 2021-07-03 DIAGNOSIS — I10 Essential (primary) hypertension: Secondary | ICD-10-CM | POA: Diagnosis not present

## 2021-07-03 DIAGNOSIS — N529 Male erectile dysfunction, unspecified: Secondary | ICD-10-CM | POA: Diagnosis not present

## 2021-07-03 DIAGNOSIS — M1990 Unspecified osteoarthritis, unspecified site: Secondary | ICD-10-CM | POA: Diagnosis not present

## 2021-07-03 DIAGNOSIS — E7800 Pure hypercholesterolemia, unspecified: Secondary | ICD-10-CM | POA: Diagnosis not present

## 2021-07-03 DIAGNOSIS — N401 Enlarged prostate with lower urinary tract symptoms: Secondary | ICD-10-CM | POA: Diagnosis not present

## 2021-07-03 DIAGNOSIS — R351 Nocturia: Secondary | ICD-10-CM | POA: Diagnosis not present

## 2021-07-03 DIAGNOSIS — E669 Obesity, unspecified: Secondary | ICD-10-CM | POA: Diagnosis not present

## 2021-07-03 DIAGNOSIS — E119 Type 2 diabetes mellitus without complications: Secondary | ICD-10-CM | POA: Diagnosis not present

## 2021-07-10 DIAGNOSIS — E119 Type 2 diabetes mellitus without complications: Secondary | ICD-10-CM | POA: Diagnosis not present

## 2021-11-19 DIAGNOSIS — E7800 Pure hypercholesterolemia, unspecified: Secondary | ICD-10-CM | POA: Diagnosis not present

## 2021-11-19 DIAGNOSIS — Z125 Encounter for screening for malignant neoplasm of prostate: Secondary | ICD-10-CM | POA: Diagnosis not present

## 2021-11-19 DIAGNOSIS — E119 Type 2 diabetes mellitus without complications: Secondary | ICD-10-CM | POA: Diagnosis not present

## 2021-11-19 DIAGNOSIS — I10 Essential (primary) hypertension: Secondary | ICD-10-CM | POA: Diagnosis not present

## 2021-11-26 DIAGNOSIS — N401 Enlarged prostate with lower urinary tract symptoms: Secondary | ICD-10-CM | POA: Diagnosis not present

## 2021-11-26 DIAGNOSIS — R82998 Other abnormal findings in urine: Secondary | ICD-10-CM | POA: Diagnosis not present

## 2021-11-26 DIAGNOSIS — E669 Obesity, unspecified: Secondary | ICD-10-CM | POA: Diagnosis not present

## 2021-11-26 DIAGNOSIS — M1990 Unspecified osteoarthritis, unspecified site: Secondary | ICD-10-CM | POA: Diagnosis not present

## 2021-11-26 DIAGNOSIS — Z1339 Encounter for screening examination for other mental health and behavioral disorders: Secondary | ICD-10-CM | POA: Diagnosis not present

## 2021-11-26 DIAGNOSIS — Z0000 Encounter for general adult medical examination without abnormal findings: Secondary | ICD-10-CM | POA: Diagnosis not present

## 2021-11-26 DIAGNOSIS — Z1331 Encounter for screening for depression: Secondary | ICD-10-CM | POA: Diagnosis not present

## 2021-11-26 DIAGNOSIS — D692 Other nonthrombocytopenic purpura: Secondary | ICD-10-CM | POA: Diagnosis not present

## 2021-11-26 DIAGNOSIS — E119 Type 2 diabetes mellitus without complications: Secondary | ICD-10-CM | POA: Diagnosis not present

## 2021-11-26 DIAGNOSIS — E7800 Pure hypercholesterolemia, unspecified: Secondary | ICD-10-CM | POA: Diagnosis not present

## 2021-11-26 DIAGNOSIS — I10 Essential (primary) hypertension: Secondary | ICD-10-CM | POA: Diagnosis not present

## 2021-11-26 DIAGNOSIS — Z85038 Personal history of other malignant neoplasm of large intestine: Secondary | ICD-10-CM | POA: Diagnosis not present

## 2022-06-10 DIAGNOSIS — E669 Obesity, unspecified: Secondary | ICD-10-CM | POA: Diagnosis not present

## 2022-06-10 DIAGNOSIS — D692 Other nonthrombocytopenic purpura: Secondary | ICD-10-CM | POA: Diagnosis not present

## 2022-06-10 DIAGNOSIS — I10 Essential (primary) hypertension: Secondary | ICD-10-CM | POA: Diagnosis not present

## 2022-06-10 DIAGNOSIS — E7800 Pure hypercholesterolemia, unspecified: Secondary | ICD-10-CM | POA: Diagnosis not present

## 2022-06-10 DIAGNOSIS — E119 Type 2 diabetes mellitus without complications: Secondary | ICD-10-CM | POA: Diagnosis not present

## 2022-06-10 DIAGNOSIS — M1990 Unspecified osteoarthritis, unspecified site: Secondary | ICD-10-CM | POA: Diagnosis not present

## 2022-06-10 DIAGNOSIS — N529 Male erectile dysfunction, unspecified: Secondary | ICD-10-CM | POA: Diagnosis not present

## 2022-06-10 DIAGNOSIS — Z85038 Personal history of other malignant neoplasm of large intestine: Secondary | ICD-10-CM | POA: Diagnosis not present

## 2022-06-10 DIAGNOSIS — R051 Acute cough: Secondary | ICD-10-CM | POA: Diagnosis not present

## 2022-12-08 DIAGNOSIS — N529 Male erectile dysfunction, unspecified: Secondary | ICD-10-CM | POA: Diagnosis not present

## 2022-12-08 DIAGNOSIS — E7800 Pure hypercholesterolemia, unspecified: Secondary | ICD-10-CM | POA: Diagnosis not present

## 2022-12-08 DIAGNOSIS — R7989 Other specified abnormal findings of blood chemistry: Secondary | ICD-10-CM | POA: Diagnosis not present

## 2022-12-08 DIAGNOSIS — E119 Type 2 diabetes mellitus without complications: Secondary | ICD-10-CM | POA: Diagnosis not present

## 2022-12-08 DIAGNOSIS — Z125 Encounter for screening for malignant neoplasm of prostate: Secondary | ICD-10-CM | POA: Diagnosis not present

## 2022-12-08 DIAGNOSIS — I10 Essential (primary) hypertension: Secondary | ICD-10-CM | POA: Diagnosis not present

## 2022-12-15 DIAGNOSIS — Z1331 Encounter for screening for depression: Secondary | ICD-10-CM | POA: Diagnosis not present

## 2022-12-15 DIAGNOSIS — D696 Thrombocytopenia, unspecified: Secondary | ICD-10-CM | POA: Diagnosis not present

## 2022-12-15 DIAGNOSIS — Z6832 Body mass index (BMI) 32.0-32.9, adult: Secondary | ICD-10-CM | POA: Diagnosis not present

## 2022-12-15 DIAGNOSIS — Z85038 Personal history of other malignant neoplasm of large intestine: Secondary | ICD-10-CM | POA: Diagnosis not present

## 2022-12-15 DIAGNOSIS — E7800 Pure hypercholesterolemia, unspecified: Secondary | ICD-10-CM | POA: Diagnosis not present

## 2022-12-15 DIAGNOSIS — Z0000 Encounter for general adult medical examination without abnormal findings: Secondary | ICD-10-CM | POA: Diagnosis not present

## 2022-12-15 DIAGNOSIS — E669 Obesity, unspecified: Secondary | ICD-10-CM | POA: Diagnosis not present

## 2022-12-15 DIAGNOSIS — E119 Type 2 diabetes mellitus without complications: Secondary | ICD-10-CM | POA: Diagnosis not present

## 2022-12-15 DIAGNOSIS — Z1339 Encounter for screening examination for other mental health and behavioral disorders: Secondary | ICD-10-CM | POA: Diagnosis not present

## 2022-12-15 DIAGNOSIS — M1990 Unspecified osteoarthritis, unspecified site: Secondary | ICD-10-CM | POA: Diagnosis not present

## 2022-12-15 DIAGNOSIS — I10 Essential (primary) hypertension: Secondary | ICD-10-CM | POA: Diagnosis not present

## 2023-06-09 DIAGNOSIS — E669 Obesity, unspecified: Secondary | ICD-10-CM | POA: Diagnosis not present

## 2023-06-09 DIAGNOSIS — T466X5A Adverse effect of antihyperlipidemic and antiarteriosclerotic drugs, initial encounter: Secondary | ICD-10-CM | POA: Diagnosis not present

## 2023-06-09 DIAGNOSIS — E119 Type 2 diabetes mellitus without complications: Secondary | ICD-10-CM | POA: Diagnosis not present

## 2023-06-09 DIAGNOSIS — M1990 Unspecified osteoarthritis, unspecified site: Secondary | ICD-10-CM | POA: Diagnosis not present

## 2023-06-09 DIAGNOSIS — Z85038 Personal history of other malignant neoplasm of large intestine: Secondary | ICD-10-CM | POA: Diagnosis not present

## 2023-06-09 DIAGNOSIS — M791 Myalgia, unspecified site: Secondary | ICD-10-CM | POA: Diagnosis not present

## 2023-06-09 DIAGNOSIS — N401 Enlarged prostate with lower urinary tract symptoms: Secondary | ICD-10-CM | POA: Diagnosis not present

## 2023-06-09 DIAGNOSIS — I10 Essential (primary) hypertension: Secondary | ICD-10-CM | POA: Diagnosis not present

## 2023-06-09 DIAGNOSIS — E7800 Pure hypercholesterolemia, unspecified: Secondary | ICD-10-CM | POA: Diagnosis not present

## 2023-06-09 DIAGNOSIS — D696 Thrombocytopenia, unspecified: Secondary | ICD-10-CM | POA: Diagnosis not present

## 2023-06-09 DIAGNOSIS — D692 Other nonthrombocytopenic purpura: Secondary | ICD-10-CM | POA: Diagnosis not present

## 2023-06-09 DIAGNOSIS — Z6831 Body mass index (BMI) 31.0-31.9, adult: Secondary | ICD-10-CM | POA: Diagnosis not present

## 2023-12-26 DIAGNOSIS — R82998 Other abnormal findings in urine: Secondary | ICD-10-CM | POA: Diagnosis not present

## 2024-02-21 DIAGNOSIS — H25813 Combined forms of age-related cataract, bilateral: Secondary | ICD-10-CM | POA: Diagnosis not present

## 2024-02-21 DIAGNOSIS — E119 Type 2 diabetes mellitus without complications: Secondary | ICD-10-CM | POA: Diagnosis not present

## 2024-04-25 DIAGNOSIS — Z1152 Encounter for screening for COVID-19: Secondary | ICD-10-CM | POA: Diagnosis not present

## 2024-04-25 DIAGNOSIS — H65191 Other acute nonsuppurative otitis media, right ear: Secondary | ICD-10-CM | POA: Diagnosis not present

## 2024-04-25 DIAGNOSIS — R509 Fever, unspecified: Secondary | ICD-10-CM | POA: Diagnosis not present

## 2024-04-25 DIAGNOSIS — R0981 Nasal congestion: Secondary | ICD-10-CM | POA: Diagnosis not present

## 2024-04-25 DIAGNOSIS — J029 Acute pharyngitis, unspecified: Secondary | ICD-10-CM | POA: Diagnosis not present

## 2024-04-25 DIAGNOSIS — R5383 Other fatigue: Secondary | ICD-10-CM | POA: Diagnosis not present

## 2024-04-25 DIAGNOSIS — E119 Type 2 diabetes mellitus without complications: Secondary | ICD-10-CM | POA: Diagnosis not present

## 2024-04-25 DIAGNOSIS — R051 Acute cough: Secondary | ICD-10-CM | POA: Diagnosis not present

## 2024-06-25 DIAGNOSIS — M75122 Complete rotator cuff tear or rupture of left shoulder, not specified as traumatic: Secondary | ICD-10-CM | POA: Diagnosis not present

## 2024-06-25 DIAGNOSIS — M12812 Other specific arthropathies, not elsewhere classified, left shoulder: Secondary | ICD-10-CM | POA: Diagnosis not present

## 2024-06-25 DIAGNOSIS — E119 Type 2 diabetes mellitus without complications: Secondary | ICD-10-CM | POA: Diagnosis not present

## 2024-06-25 DIAGNOSIS — M25512 Pain in left shoulder: Secondary | ICD-10-CM | POA: Diagnosis not present

## 2024-06-26 DIAGNOSIS — Z09 Encounter for follow-up examination after completed treatment for conditions other than malignant neoplasm: Secondary | ICD-10-CM | POA: Diagnosis not present

## 2024-06-26 DIAGNOSIS — Z85038 Personal history of other malignant neoplasm of large intestine: Secondary | ICD-10-CM | POA: Diagnosis not present

## 2024-06-26 DIAGNOSIS — Z860101 Personal history of adenomatous and serrated colon polyps: Secondary | ICD-10-CM | POA: Diagnosis not present

## 2024-06-27 ENCOUNTER — Other Ambulatory Visit: Payer: Self-pay | Admitting: Orthopedic Surgery

## 2024-06-27 DIAGNOSIS — M75122 Complete rotator cuff tear or rupture of left shoulder, not specified as traumatic: Secondary | ICD-10-CM

## 2024-07-05 ENCOUNTER — Ambulatory Visit: Payer: Self-pay

## 2024-07-05 DIAGNOSIS — Z08 Encounter for follow-up examination after completed treatment for malignant neoplasm: Secondary | ICD-10-CM | POA: Diagnosis not present

## 2024-07-05 DIAGNOSIS — K640 First degree hemorrhoids: Secondary | ICD-10-CM | POA: Diagnosis not present

## 2024-07-05 DIAGNOSIS — Z85038 Personal history of other malignant neoplasm of large intestine: Secondary | ICD-10-CM | POA: Diagnosis not present

## 2024-07-05 DIAGNOSIS — Z980 Intestinal bypass and anastomosis status: Secondary | ICD-10-CM | POA: Diagnosis not present

## 2024-07-05 DIAGNOSIS — Z860100 Personal history of colon polyps, unspecified: Secondary | ICD-10-CM | POA: Diagnosis not present

## 2024-07-10 ENCOUNTER — Ambulatory Visit
Admission: RE | Admit: 2024-07-10 | Discharge: 2024-07-10 | Disposition: A | Discharge status: Home / Self Care | Source: Ambulatory Visit | Attending: Orthopedic Surgery | Admitting: Orthopedic Surgery

## 2024-07-10 DIAGNOSIS — M25412 Effusion, left shoulder: Secondary | ICD-10-CM | POA: Diagnosis not present

## 2024-07-10 DIAGNOSIS — M75122 Complete rotator cuff tear or rupture of left shoulder, not specified as traumatic: Secondary | ICD-10-CM | POA: Insufficient documentation

## 2024-07-10 DIAGNOSIS — M19012 Primary osteoarthritis, left shoulder: Secondary | ICD-10-CM | POA: Diagnosis not present

## 2024-07-10 DIAGNOSIS — M7582 Other shoulder lesions, left shoulder: Secondary | ICD-10-CM | POA: Diagnosis not present

## 2024-07-25 DIAGNOSIS — Z85038 Personal history of other malignant neoplasm of large intestine: Secondary | ICD-10-CM | POA: Diagnosis not present

## 2024-07-25 DIAGNOSIS — N401 Enlarged prostate with lower urinary tract symptoms: Secondary | ICD-10-CM | POA: Diagnosis not present

## 2024-07-25 DIAGNOSIS — E119 Type 2 diabetes mellitus without complications: Secondary | ICD-10-CM | POA: Diagnosis not present

## 2024-07-25 DIAGNOSIS — M791 Myalgia, unspecified site: Secondary | ICD-10-CM | POA: Diagnosis not present

## 2024-07-25 DIAGNOSIS — E669 Obesity, unspecified: Secondary | ICD-10-CM | POA: Diagnosis not present

## 2024-07-25 DIAGNOSIS — D696 Thrombocytopenia, unspecified: Secondary | ICD-10-CM | POA: Diagnosis not present

## 2024-07-25 DIAGNOSIS — E7800 Pure hypercholesterolemia, unspecified: Secondary | ICD-10-CM | POA: Diagnosis not present

## 2024-07-25 DIAGNOSIS — G720 Drug-induced myopathy: Secondary | ICD-10-CM | POA: Diagnosis not present

## 2024-07-25 DIAGNOSIS — M1990 Unspecified osteoarthritis, unspecified site: Secondary | ICD-10-CM | POA: Diagnosis not present

## 2024-07-25 DIAGNOSIS — I10 Essential (primary) hypertension: Secondary | ICD-10-CM | POA: Diagnosis not present

## 2024-08-01 DIAGNOSIS — M25512 Pain in left shoulder: Secondary | ICD-10-CM | POA: Diagnosis not present
